# Patient Record
Sex: Female | Born: 1995 | Marital: Married | Smoking: Never smoker
Health system: Southern US, Community
[De-identification: ages and names within clinical notes are randomized; demographics above are authoritative.]

## PROBLEM LIST (undated history)

## (undated) DIAGNOSIS — O24419 Gestational diabetes mellitus in pregnancy, unspecified control: Secondary | ICD-10-CM

## (undated) DIAGNOSIS — E079 Disorder of thyroid, unspecified: Secondary | ICD-10-CM

## (undated) HISTORY — PX: EYE SURGERY: SHX253

## (undated) HISTORY — DX: Gestational diabetes mellitus in pregnancy, unspecified control: O24.419

---

## 2016-06-22 ENCOUNTER — Ambulatory Visit (INDEPENDENT_AMBULATORY_CARE_PROVIDER_SITE_OTHER): Payer: Medicaid Other | Admitting: Obstetrics & Gynecology

## 2016-06-22 ENCOUNTER — Other Ambulatory Visit: Payer: Self-pay | Admitting: Obstetrics & Gynecology

## 2016-06-22 ENCOUNTER — Other Ambulatory Visit (HOSPITAL_COMMUNITY)
Admission: RE | Admit: 2016-06-22 | Discharge: 2016-06-22 | Disposition: A | Discharge status: Home / Self Care | Payer: Medicaid Other | Source: Ambulatory Visit | Attending: Obstetrics & Gynecology | Admitting: Obstetrics & Gynecology

## 2016-06-22 ENCOUNTER — Encounter: Payer: Self-pay | Admitting: Obstetrics & Gynecology

## 2016-06-22 VITALS — BP 111/67 | HR 105 | Ht 61.0 in | Wt 170.0 lb

## 2016-06-22 DIAGNOSIS — O34219 Maternal care for unspecified type scar from previous cesarean delivery: Secondary | ICD-10-CM | POA: Insufficient documentation

## 2016-06-22 DIAGNOSIS — Z3490 Encounter for supervision of normal pregnancy, unspecified, unspecified trimester: Secondary | ICD-10-CM

## 2016-06-22 DIAGNOSIS — Z3480 Encounter for supervision of other normal pregnancy, unspecified trimester: Secondary | ICD-10-CM | POA: Insufficient documentation

## 2016-06-22 DIAGNOSIS — Z8632 Personal history of gestational diabetes: Secondary | ICD-10-CM

## 2016-06-22 DIAGNOSIS — Z01419 Encounter for gynecological examination (general) (routine) without abnormal findings: Secondary | ICD-10-CM | POA: Insufficient documentation

## 2016-06-22 DIAGNOSIS — O09299 Supervision of pregnancy with other poor reproductive or obstetric history, unspecified trimester: Secondary | ICD-10-CM | POA: Insufficient documentation

## 2016-06-22 DIAGNOSIS — Z113 Encounter for screening for infections with a predominantly sexual mode of transmission: Secondary | ICD-10-CM | POA: Insufficient documentation

## 2016-06-22 DIAGNOSIS — Z3492 Encounter for supervision of normal pregnancy, unspecified, second trimester: Secondary | ICD-10-CM

## 2016-06-22 DIAGNOSIS — O09292 Supervision of pregnancy with other poor reproductive or obstetric history, second trimester: Secondary | ICD-10-CM | POA: Diagnosis not present

## 2016-06-22 NOTE — Progress Notes (Signed)
  Subjective:    Caitlyn Mccormick is being seen today for her first obstetrical visit.  This is not a planned pregnancy. She is at 3063w3d gestation. Her obstetrical history is significant for GDM with ehr last pregnancy.  She was diet controlled. Relationship with FOB: spouse, living together. Patient does intend to breast feed. Pregnancy history fully reviewed.  Pt was recently on atbx for a UTI. Now has itching. [place] to confirm [place].    Patient reports nausea in early pregnancy that has resolved.  Review of Systems:   Review of Systems [place]  Objective:     BP 111/67   Pulse (!) 105   Ht 5\' 1"  (1.549 m)   Wt 170 lb (77.1 kg)   LMP 02/14/2016   BMI 32.12 kg/m  Physical Exam  Exam General Appearance:    Alert, cooperative, no distress, appears stated age  Head:    Normocephalic, without obvious abnormality, atraumatic  Eyes:    conjunctiva/corneas clear, EOM's intact, both eyes  Ears:    Normal external ear canals, both ears  Nose:   Nares normal, septum midline, mucosa normal, no drainage    or sinus tenderness  Throat:   Lips, mucosa, and tongue normal; teeth and gums normal  Neck:   Supple, symmetrical, trachea midline, no adenopathy;    thyroid:  no enlargement/tenderness/nodules  Back:     Symmetric, no curvature, ROM normal, no CVA tenderness  Lungs:     Clear to auscultation bilaterally, respirations unlabored  Chest Wall:    No tenderness or deformity   Heart:    Regular rate and rhythm, S1 and S2 normal, no murmur, rub   or gallop  Breast Exam:    No tenderness, masses, or nipple abnormality  Abdomen:     Soft, non-tender, bowel sounds active all four quadrants,    no masses, no organomegaly  Genitalia:    Normal female without lesion, discharge or tenderness; thick white discharge and erythema     Extremities:   Extremities normal, atraumatic, no cyanosis or edema  Pulses:   2+ and symmetric all extremities  Skin:   Skin color, texture, turgor  normal, no rashes or lesions     Assessment:    Pregnancy: G2P1001    Patient Active Problem List   Diagnosis Date Noted  . Supervision of other normal pregnancy, antepartum 06/22/2016  . Pregnancy with history of cesarean section, antepartum 06/22/2016  . History of gestational diabetes in prior pregnancy, currently pregnant 06/22/2016      Plan:     Initial labs drawn. Prenatal vitamins. Problem list reviewed and updated. AFP3 discussed: requested. Role of ultrasound in pregnancy discussed; fetal survey: requested. Amniocentesis discussed: not indicated. Follow up in 4 weeks. 70% of 45 min visit spent on counseling and coordination of care.  1 hour GTT with HgbA1C  Willodean Rosenthalarolyn Harraway-Smith 06/22/2016

## 2016-06-22 NOTE — Patient Instructions (Signed)
Second Trimester of Pregnancy The second trimester is from week 13 through week 28 (months 4 through 6). The second trimester is often a time when you feel your best. Your body has also adjusted to being pregnant, and you begin to feel better physically. Usually, morning sickness has lessened or quit completely, you may have more energy, and you may have an increase in appetite. The second trimester is also a time when the fetus is growing rapidly. At the end of the sixth month, the fetus is about 9 inches long and weighs about 1 pounds. You will likely begin to feel the baby move (quickening) between 18 and 20 weeks of the pregnancy. Body changes during your second trimester Your body continues to go through many changes during your second trimester. The changes vary from woman to woman.  Your weight will continue to increase. You will notice your lower abdomen bulging out.  You may begin to get stretch marks on your hips, abdomen, and breasts.  You may develop headaches that can be relieved by medicines. The medicines should be approved by your health care provider.  You may urinate more often because the fetus is pressing on your bladder.  You may develop or continue to have heartburn as a result of your pregnancy.  You may develop constipation because certain hormones are causing the muscles that push waste through your intestines to slow down.  You may develop hemorrhoids or swollen, bulging veins (varicose veins).  You may have back pain. This is caused by:  Weight gain.  Pregnancy hormones that are relaxing the joints in your pelvis.  A shift in weight and the muscles that support your balance.  Your breasts will continue to grow and they will continue to become tender.  Your gums may bleed and may be sensitive to brushing and flossing.  Dark spots or blotches (chloasma, mask of pregnancy) may develop on your face. This will likely fade after the baby is born.  A dark line  from your belly button to the pubic area (linea nigra) may appear. This will likely fade after the baby is born.  You may have changes in your hair. These can include thickening of your hair, rapid growth, and changes in texture. Some women also have hair loss during or after pregnancy, or hair that feels dry or thin. Your hair will most likely return to normal after your baby is born. What to expect at prenatal visits During a routine prenatal visit:  You will be weighed to make sure you and the fetus are growing normally.  Your blood pressure will be taken.  Your abdomen will be measured to track your baby's growth.  The fetal heartbeat will be listened to.  Any test results from the previous visit will be discussed. Your health care provider may ask you:  How you are feeling.  If you are feeling the baby move.  If you have had any abnormal symptoms, such as leaking fluid, bleeding, severe headaches, or abdominal cramping.  If you are using any tobacco products, including cigarettes, chewing tobacco, and electronic cigarettes.  If you have any questions. Other tests that may be performed during your second trimester include:  Blood tests that check for:  Low iron levels (anemia).  Gestational diabetes (between 24 and 28 weeks).  Rh antibodies. This is to check for a protein on red blood cells (Rh factor).  Urine tests to check for infections, diabetes, or protein in the urine.  An ultrasound to   confirm the proper growth and development of the baby.  An amniocentesis to check for possible genetic problems.  Fetal screens for spina bifida and Down syndrome.  HIV (human immunodeficiency virus) testing. Routine prenatal testing includes screening for HIV, unless you choose not to have this test. Follow these instructions at home: Eating and drinking  Continue to eat regular, healthy meals.  Avoid raw meat, uncooked cheese, cat litter boxes, and soil used by cats. These  carry germs that can cause birth defects in the baby.  Take your prenatal vitamins.  Take 1500-2000 mg of calcium daily starting at the 20th week of pregnancy until you deliver your baby.  If you develop constipation:  Take over-the-counter or prescription medicines.  Drink enough fluid to keep your urine clear or pale yellow.  Eat foods that are high in fiber, such as fresh fruits and vegetables, whole grains, and beans.  Limit foods that are high in fat and processed sugars, such as fried and sweet foods. Activity  Exercise only as directed by your health care provider. Experiencing uterine cramps is a good sign to stop exercising.  Avoid heavy lifting, wear low heel shoes, and practice good posture.  Wear your seat belt at all times when driving.  Rest with your legs elevated if you have leg cramps or low back pain.  Wear a good support bra for breast tenderness.  Do not use hot tubs, steam rooms, or saunas. Lifestyle  Avoid all smoking, herbs, alcohol, and unprescribed drugs. These chemicals affect the formation and growth of the baby.  Do not use any products that contain nicotine or tobacco, such as cigarettes and e-cigarettes. If you need help quitting, ask your health care provider.  A sexual relationship may be continued unless your health care provider directs you otherwise. General instructions  Follow your health care provider's instructions regarding medicine use. There are medicines that are either safe or unsafe to take during pregnancy.  Take warm sitz baths to soothe any pain or discomfort caused by hemorrhoids. Use hemorrhoid cream if your health care provider approves.  If you develop varicose veins, wear support hose. Elevate your feet for 15 minutes, 3-4 times a day. Limit salt in your diet.  Visit your dentist if you have not gone yet during your pregnancy. Use a soft toothbrush to brush your teeth and be gentle when you floss.  Keep all follow-up  prenatal visits as told by your health care provider. This is important. Contact a health care provider if:  You have dizziness.  You have mild pelvic cramps, pelvic pressure, or nagging pain in the abdominal area.  You have persistent nausea, vomiting, or diarrhea.  You have a bad smelling vaginal discharge.  You have pain with urination. Get help right away if:  You have a fever.  You are leaking fluid from your vagina.  You have spotting or bleeding from your vagina.  You have severe abdominal cramping or pain.  You have rapid weight gain or weight loss.  You have shortness of breath with chest pain.  You notice sudden or extreme swelling of your face, hands, ankles, feet, or legs.  You have not felt your baby move in over an hour.  You have severe headaches that do not go away with medicine.  You have vision changes. Summary  The second trimester is from week 13 through week 28 (months 4 through 6). It is also a time when the fetus is growing rapidly.  Your body goes   through many changes during pregnancy. The changes vary from woman to woman.  Avoid all smoking, herbs, alcohol, and unprescribed drugs. These chemicals affect the formation and growth your baby.  Do not use any tobacco products, such as cigarettes, chewing tobacco, and e-cigarettes. If you need help quitting, ask your health care provider.  Contact your health care provider if you have any questions. Keep all prenatal visits as told by your health care provider. This is important. This information is not intended to replace advice given to you by your health care provider. Make sure you discuss any questions you have with your health care provider. Document Released: 06/16/2001 Document Revised: 11/28/2015 Document Reviewed: 08/23/2012 Elsevier Interactive Patient Education  2017 [employer]  

## 2016-06-22 NOTE — Progress Notes (Signed)
Patient states she had a UTI she was seen in the ED for about a month. Patient states she is feeling flutters. Armandina StammerJennifer Gracynn Rajewski RNBSN

## 2016-06-23 LAB — OBSTETRIC PANEL
Antibody Screen: NEGATIVE
Basophils Absolute: 0 cells/uL (ref 0–200)
Basophils Relative: 0 %
Eosinophils Absolute: 95 cells/uL (ref 15–500)
Eosinophils Relative: 1 %
HCT: 33.8 % — ABNORMAL LOW (ref 35.0–45.0)
HEP B S AG: NEGATIVE
Hemoglobin: 10.8 g/dL — ABNORMAL LOW (ref 11.7–15.5)
LYMPHS ABS: 1710 {cells}/uL (ref 850–3900)
Lymphocytes Relative: 18 %
MCH: 25 pg — AB (ref 27.0–33.0)
MCHC: 32 g/dL (ref 32.0–36.0)
MCV: 78.2 fL — AB (ref 80.0–100.0)
MONO ABS: 475 {cells}/uL (ref 200–950)
MPV: 9 fL (ref 7.5–12.5)
Monocytes Relative: 5 %
NEUTROS PCT: 76 %
Neutro Abs: 7220 cells/uL (ref 1500–7800)
PLATELETS: 233 10*3/uL (ref 140–400)
RBC: 4.32 MIL/uL (ref 3.80–5.10)
RDW: 15.6 % — AB (ref 11.0–15.0)
RH TYPE: NEGATIVE
RUBELLA: 1.3 {index} — AB (ref <0.90)
WBC: 9.5 10*3/uL (ref 3.8–10.8)

## 2016-06-23 LAB — AFP, QUAD SCREEN
AFP: 49.1 ng/mL
Curr Gest Age: 18.4 weeks
Down Syndrome Scr Risk Est: 1:31500 {titer}
HCG TOTAL: 35.8 [IU]/mL
INH: 175.9 pg/mL
Interpretation-AFP: NEGATIVE
MoM for AFP: 1.16
MoM for INH: 1.08
MoM for hCG: 1.48
OPEN SPINA BIFIDA: NEGATIVE
Tri 18 Scr Risk Est: NEGATIVE
Trisomy 18 (Edward) Syndrome Interp.: <1:115000 {titer}
UE3 MOM: 1.56
uE3 Value: 2.03 ng/mL

## 2016-06-23 LAB — HEMOGLOBIN A1C
Hgb A1c MFr Bld: 5.1 % (ref <5.7)
Mean Plasma Glucose: 100 mg/dL

## 2016-06-23 LAB — HIV ANTIBODY (ROUTINE TESTING W REFLEX): HIV 1&2 Ab, 4th Generation: NONREACTIVE

## 2016-06-23 LAB — WET PREP BY MOLECULAR PROBE
Candida species: NEGATIVE
GARDNERELLA VAGINALIS: NEGATIVE
Trichomonas vaginosis: NEGATIVE

## 2016-06-23 LAB — GC/CHLAMYDIA PROBE AMP (~~LOC~~) NOT AT ARMC
Chlamydia: NEGATIVE
NEISSERIA GONORRHEA: NEGATIVE

## 2016-06-23 LAB — GLUCOSE TOLERANCE, 1 HOUR (50G) W/O FASTING: Glucose, 1 Hr, gestational: 81 mg/dL (ref <140)

## 2016-06-24 LAB — CYTOLOGY - PAP: Diagnosis: NEGATIVE

## 2016-06-25 ENCOUNTER — Encounter: Payer: Self-pay | Admitting: Obstetrics & Gynecology

## 2016-06-25 DIAGNOSIS — O26899 Other specified pregnancy related conditions, unspecified trimester: Secondary | ICD-10-CM

## 2016-06-25 DIAGNOSIS — Z6791 Unspecified blood type, Rh negative: Secondary | ICD-10-CM | POA: Insufficient documentation

## 2016-06-25 DIAGNOSIS — O99019 Anemia complicating pregnancy, unspecified trimester: Secondary | ICD-10-CM | POA: Insufficient documentation

## 2016-06-25 LAB — PAIN MGMT, OPIATES EXP. QN, UR
Codeine: NEGATIVE ng/mL (ref <50)
Hydrocodone: NEGATIVE ng/mL (ref <50)
Hydromorphone: NEGATIVE ng/mL (ref <50)
MORPHINE: NEGATIVE ng/mL (ref <50)
NORHYDROCODONE: NEGATIVE ng/mL (ref <50)
NOROXYCODONE: NEGATIVE ng/mL (ref <50)
OXYCODONE: NEGATIVE ng/mL (ref <50)
Oxymorphone: NEGATIVE ng/mL (ref <50)

## 2016-06-25 LAB — CULTURE, URINE COMPREHENSIVE

## 2016-06-26 ENCOUNTER — Telehealth: Payer: Self-pay

## 2016-06-26 ENCOUNTER — Ambulatory Visit (HOSPITAL_COMMUNITY): Payer: Medicaid Other

## 2016-06-26 NOTE — Telephone Encounter (Signed)
Left message for patient to return call to office.  Ocean Schildt RNBSN 

## 2016-06-26 NOTE — Telephone Encounter (Signed)
-----   Message from Caitlyn Rosenthalarolyn Harraway-Smith, MD sent at 06/25/2016  5:20 PM EST ----- Please call pt. She is anemic. She should begin FeSO4 OTC once a day.  clh-[id number]

## 2016-06-30 ENCOUNTER — Ambulatory Visit (HOSPITAL_COMMUNITY)
Admission: RE | Admit: 2016-06-30 | Discharge: 2016-06-30 | Disposition: A | Discharge status: Home / Self Care | Payer: Medicaid Other | Source: Ambulatory Visit | Attending: Obstetrics & Gynecology | Admitting: Obstetrics & Gynecology

## 2016-06-30 DIAGNOSIS — Z6791 Unspecified blood type, Rh negative: Secondary | ICD-10-CM | POA: Insufficient documentation

## 2016-06-30 DIAGNOSIS — Z3490 Encounter for supervision of normal pregnancy, unspecified, unspecified trimester: Secondary | ICD-10-CM

## 2016-06-30 DIAGNOSIS — O34211 Maternal care for low transverse scar from previous cesarean delivery: Secondary | ICD-10-CM | POA: Diagnosis not present

## 2016-06-30 DIAGNOSIS — O09292 Supervision of pregnancy with other poor reproductive or obstetric history, second trimester: Secondary | ICD-10-CM | POA: Insufficient documentation

## 2016-06-30 DIAGNOSIS — Z3A19 19 weeks gestation of pregnancy: Secondary | ICD-10-CM | POA: Insufficient documentation

## 2016-06-30 DIAGNOSIS — O0932 Supervision of pregnancy with insufficient antenatal care, second trimester: Secondary | ICD-10-CM | POA: Insufficient documentation

## 2016-06-30 DIAGNOSIS — Z3480 Encounter for supervision of other normal pregnancy, unspecified trimester: Secondary | ICD-10-CM

## 2016-06-30 DIAGNOSIS — O99212 Obesity complicating pregnancy, second trimester: Secondary | ICD-10-CM | POA: Insufficient documentation

## 2016-06-30 DIAGNOSIS — O26893 Other specified pregnancy related conditions, third trimester: Secondary | ICD-10-CM | POA: Insufficient documentation

## 2016-06-30 IMAGING — US US MFM OB COMP +14 WKS
1 series · 14 of 28 positions shown · non-contrast
Comparison: none

[Series 1: us mfm ob comp +14 wks · 98 acquisitions, 14 frames shown]
[im 4/98]
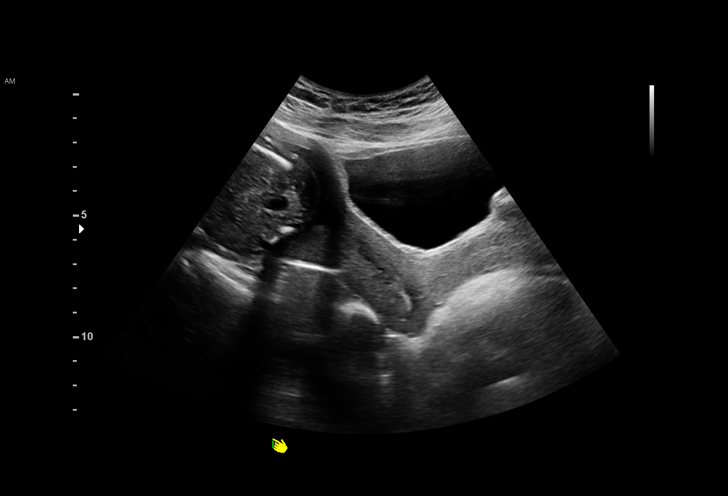
[im 11/98]
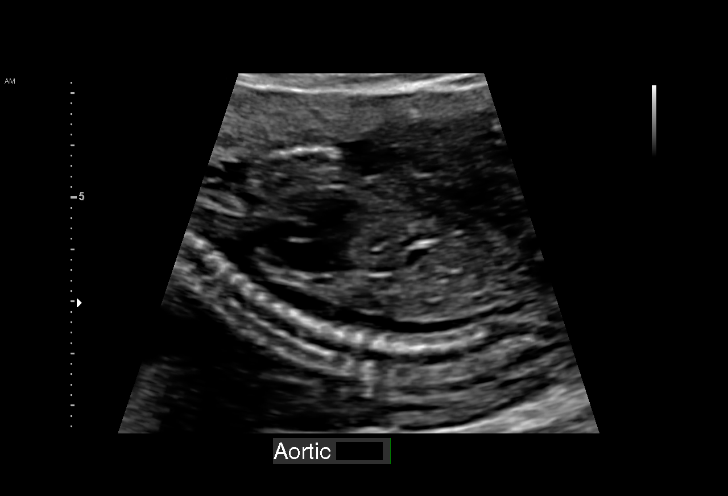
[im 18/98]
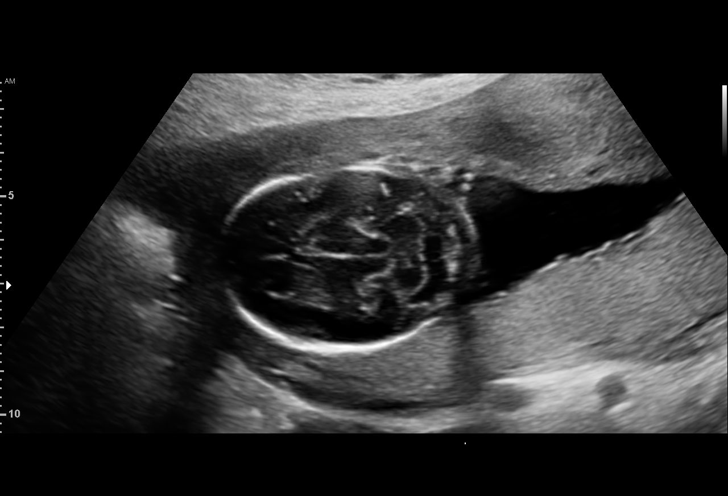
[im 26/98]
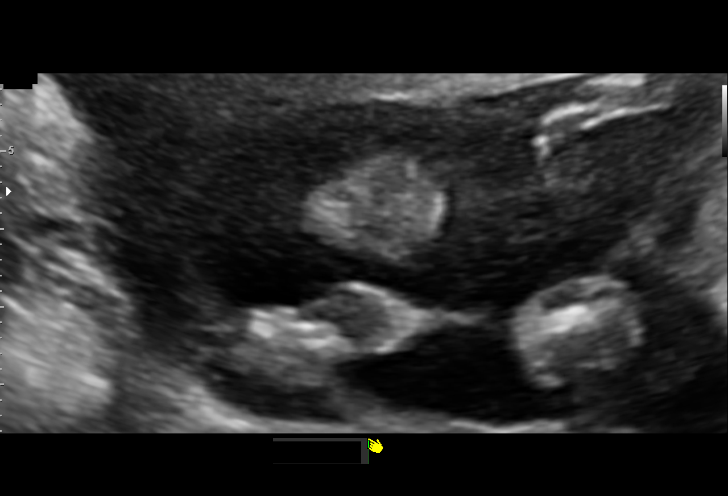
[im 33/98]
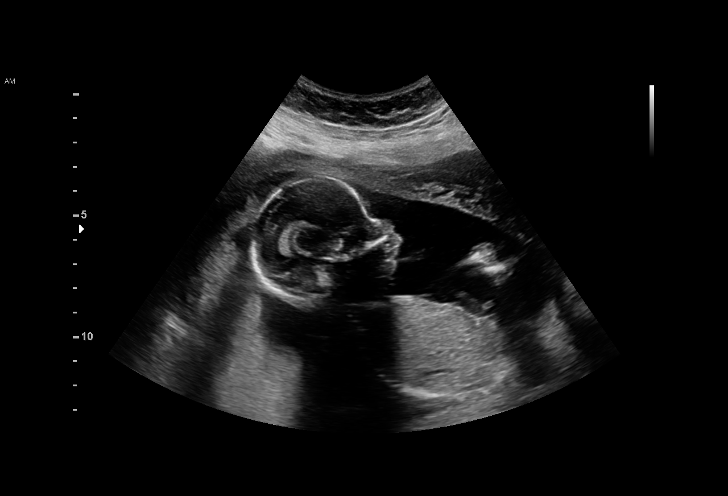
[im 40/98]
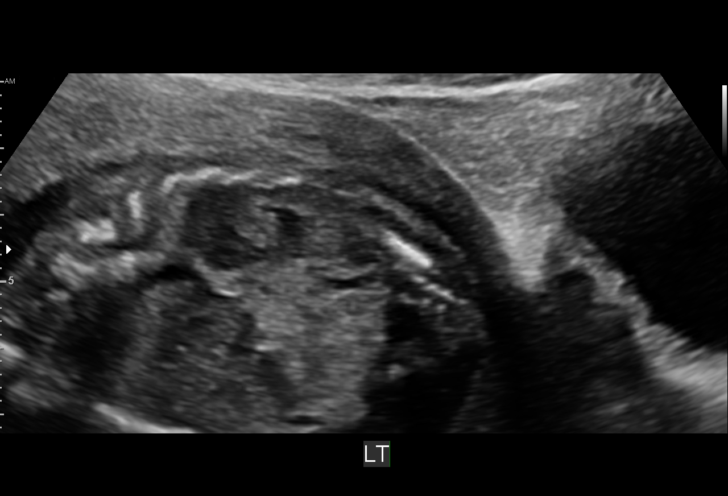
[im 47/98]
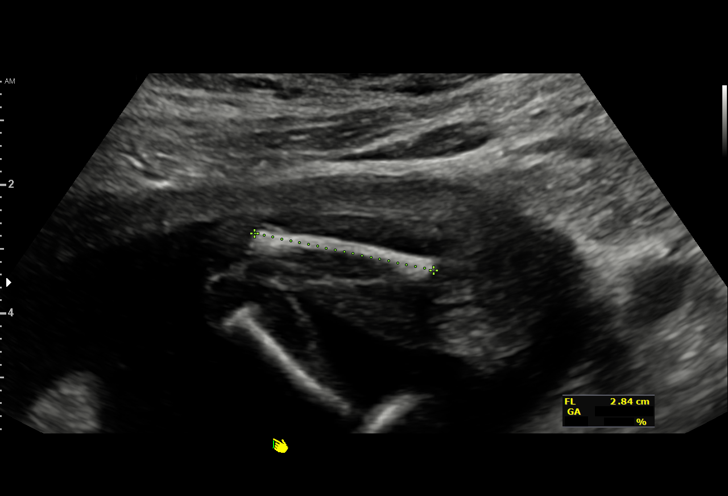
[im 54/98]
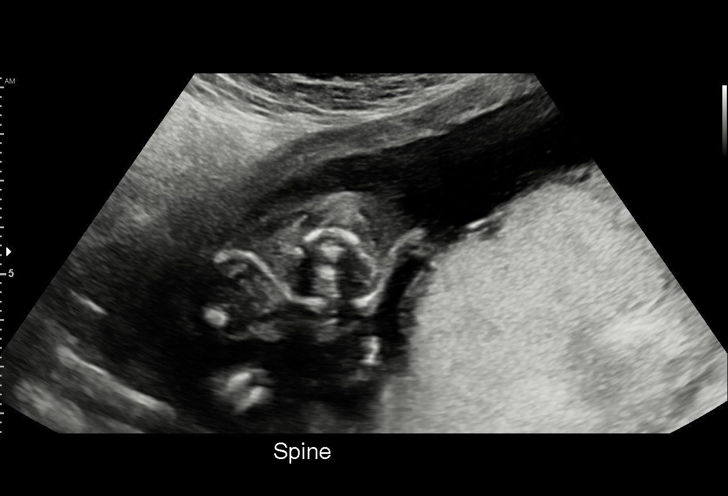
[im 62/98]
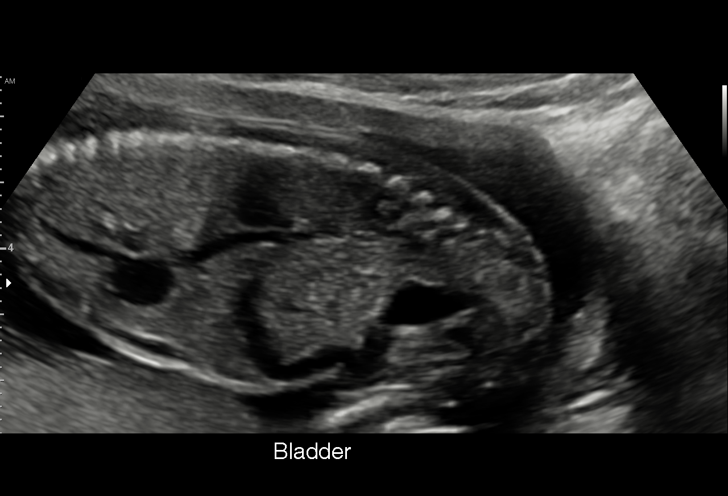
[im 69/98]
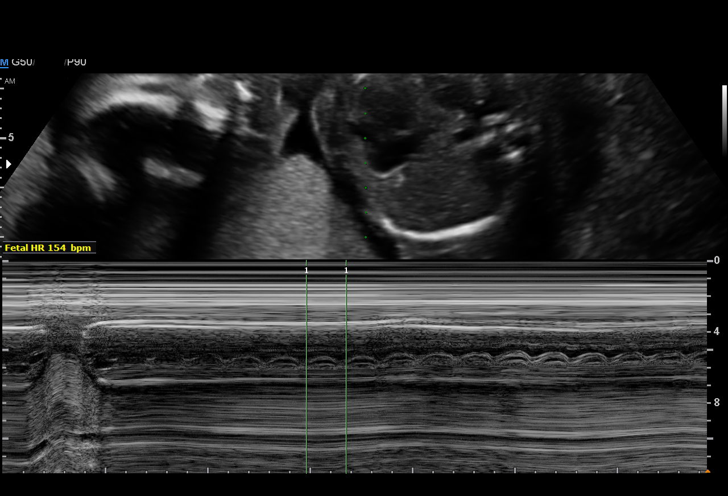
[im 76/98]
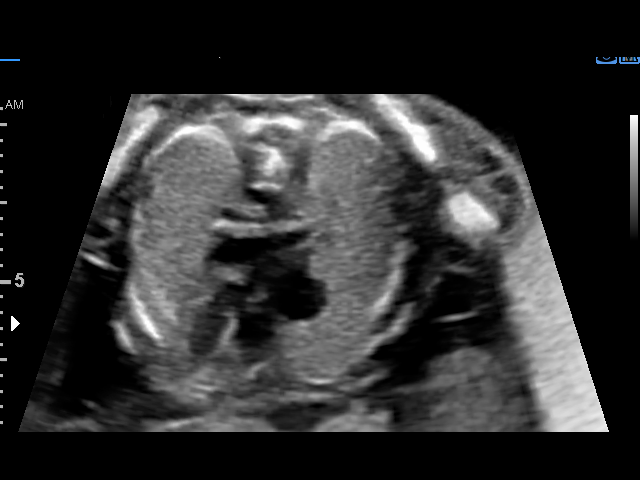
[im 83/98]
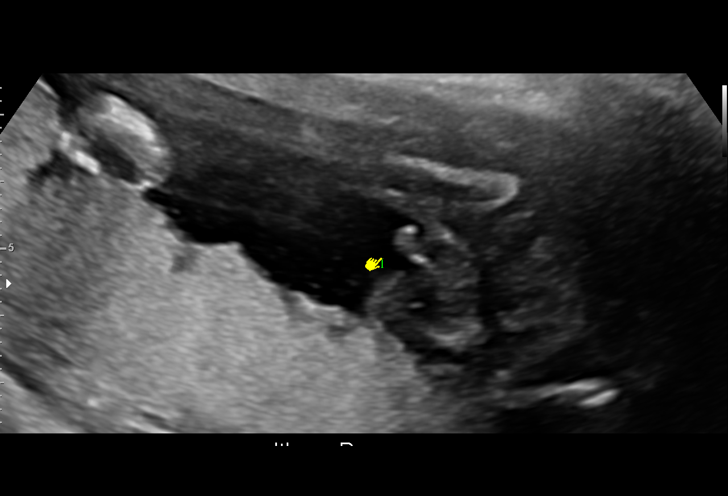
[im 90/98]
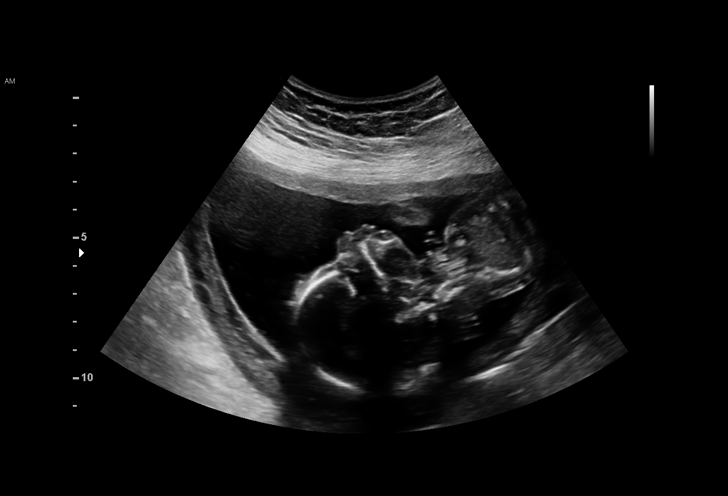
[im 98/98]
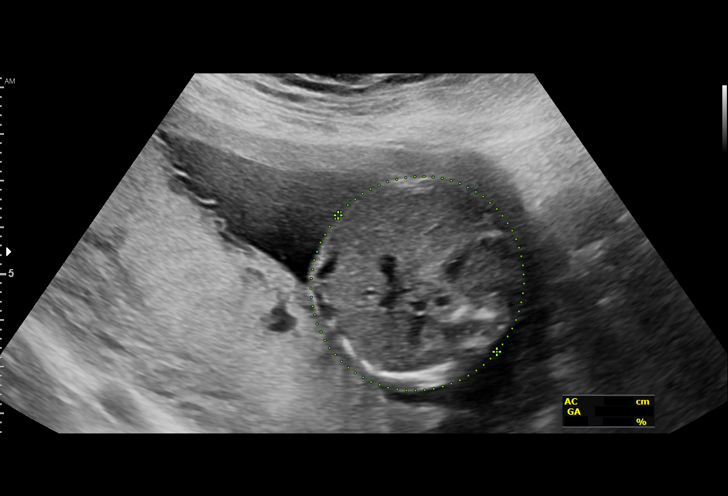

[14 of 28 positions shown; findings below may reference images not displayed]

OURARI

OURARI
Indications

19 weeks gestation of pregnancy
Obesity complicating pregnancy, second         [VH]
trimester
History of cesarean delivery, currently        [VH]
pregnant
Rh negative state in antepartum                [VH]
Poor obstetric history: Previous gestational   [VH]
diabetes
Late prenatal care, second trimester           [VH]
OB History

Blood Type:            Height:  5'1"   Weight (lb):  170       BMI:
Gravidity:    2         Term:   1        Prem:   0        SAB:   0
TOP:          0       Ectopic:  0        Living: 1
Fetal Evaluation

Num Of Fetuses:     1
Fetal Heart         154
Rate(bpm):
Cardiac Activity:   Observed
Presentation:       Variable
Placenta:           Posterior, above cervical os
P. Cord Insertion:  Visualized, central

Amniotic Fluid
AFI FV:      Subjectively within normal limits

Largest Pocket(cm)
3.44
Biometry

BPD:      41.7  mm     G. Age:  18w 4d         15  %    CI:        66.58   %    70 - 86
FL/HC:      17.9   %    16.8 -
HC:      163.8  mm     G. Age:  19w 1d         23  %    HC/AC:      1.16        1.09 -
AC:      141.7  mm     G. Age:  19w 4d         44  %    FL/BPD:     70.3   %
FL:       29.3  mm     G. Age:  19w 0d         24  %    FL/AC:      20.7   %    20 - 24
CER:      18.9  mm     G. Age:  18w 3d         19  %
NFT:       6.1  mm
CM:        5.3  mm

Est. FW:     282  gm    0 lb 10 oz      41  %
Gestational Age

LMP:           19w 4d        Date:  [DATE]                 EDD:   [DATE]
U/S Today:     19w 1d                                        EDD:   [DATE]
Best:          19w 4d     Det. By:  LMP  ([DATE])          EDD:   [DATE]
Anatomy

Cranium:               Appears normal         Aortic Arch:            Appears normal
Cavum:                 Appears normal         Ductal Arch:            Appears normal
Ventricles:            Appears normal         Diaphragm:              Appears normal
Choroid Plexus:        Appears normal         Stomach:                Appears normal, left
sided
Cerebellum:            Appears normal         Abdomen:                Appears normal
Posterior Fossa:       Appears normal         Abdominal Wall:         Appears nml (cord
insert, abd wall)
Nuchal Fold:           Appears normal         Cord Vessels:           Appears normal (3
vessel cord)
Face:                  Appears normal         Kidneys:                Appear normal
(orbits and profile)
Lips:                  Appears normal         Bladder:                Appears normal
Thoracic:              Appears normal         Spine:                  Appears normal
Heart:                 Appears normal         Upper Extremities:      Appears normal
(4CH, axis, and situs
RVOT:                  Appears normal         Lower Extremities:      Appears normal
LVOT:                  Appears normal

Other:  Fetus appears to be a male. Nasal bone visualized. Technically
difficult due to maternal habitus and fetal position.
Cervix Uterus Adnexa

Cervix
Length:           3.47  cm.
Normal appearance by transabdominal scan.

Uterus
No abnormality visualized.

Left Ovary
Within normal limits.

Right Ovary
Within normal limits.

Adnexa:       No abnormality visualized.
Impression

SIUP at [VH] on attempted comprehensive survey
active singleton fetus
EFW 41st%'le
no dysmorphic features demonstrated
no previa
Recommendations

Follow up as clinically indicated.
Consider interval growth in 6-8 weeks (obesity).

## 2016-07-02 NOTE — Addendum Note (Signed)
Encounter addended by: Willodean Rosenthalarolyn Harraway-Smith, MD on: 07/02/2016  8:58 PM<BR>    Actions taken: Visit diagnoses modified, Problem List modified

## 2016-07-07 NOTE — Telephone Encounter (Signed)
Patient made aware. Armandina StammerJennifer Yaneli Keithley RNBSN

## 2016-07-23 ENCOUNTER — Encounter: Payer: Medicaid Other | Admitting: Obstetrics & Gynecology

## 2016-08-06 ENCOUNTER — Ambulatory Visit (INDEPENDENT_AMBULATORY_CARE_PROVIDER_SITE_OTHER): Payer: Medicaid Other | Admitting: Family Medicine

## 2016-08-06 VITALS — BP 117/66 | HR 110 | Wt 176.0 lb

## 2016-08-06 DIAGNOSIS — Z3482 Encounter for supervision of other normal pregnancy, second trimester: Secondary | ICD-10-CM | POA: Diagnosis not present

## 2016-08-06 DIAGNOSIS — Z3480 Encounter for supervision of other normal pregnancy, unspecified trimester: Secondary | ICD-10-CM

## 2016-08-06 DIAGNOSIS — K219 Gastro-esophageal reflux disease without esophagitis: Secondary | ICD-10-CM

## 2016-08-06 MED ORDER — PRENATAL VITAMINS 0.8 MG PO TABS: 1.0000 | ORAL_TABLET | Freq: Every day | ORAL | 12 refills | Status: AC

## 2016-08-06 MED ORDER — OMEPRAZOLE MAGNESIUM 20 MG PO TBEC: 20.0000 mg | DELAYED_RELEASE_TABLET | Freq: Every day | ORAL | 3 refills | Status: DC

## 2016-08-06 NOTE — Patient Instructions (Signed)
Second Trimester of Pregnancy The second trimester is from week 13 through week 28 (months 4 through 6). The second trimester is often a time when you feel your best. Your body has also adjusted to being pregnant, and you begin to feel better physically. Usually, morning sickness has lessened or quit completely, you may have more energy, and you may have an increase in appetite. The second trimester is also a time when the fetus is growing rapidly. At the end of the sixth month, the fetus is about 9 inches long and weighs about 1 pounds. You will likely begin to feel the baby move (quickening) between 18 and 20 weeks of the pregnancy. Body changes during your second trimester Your body continues to go through many changes during your second trimester. The changes vary from woman to woman.  Your weight will continue to increase. You will notice your lower abdomen bulging out.  You may begin to get stretch marks on your hips, abdomen, and breasts.  You may develop headaches that can be relieved by medicines. The medicines should be approved by your health care provider.  You may urinate more often because the fetus is pressing on your bladder.  You may develop or continue to have heartburn as a result of your pregnancy.  You may develop constipation because certain hormones are causing the muscles that push waste through your intestines to slow down.  You may develop hemorrhoids or swollen, bulging veins (varicose veins).  You may have back pain. This is caused by:  Weight gain.  Pregnancy hormones that are relaxing the joints in your pelvis.  A shift in weight and the muscles that support your balance.  Your breasts will continue to grow and they will continue to become tender.  Your gums may bleed and may be sensitive to brushing and flossing.  Dark spots or blotches (chloasma, mask of pregnancy) may develop on your face. This will likely fade after the baby is born.  A dark line  from your belly button to the pubic area (linea nigra) may appear. This will likely fade after the baby is born.  You may have changes in your hair. These can include thickening of your hair, rapid growth, and changes in texture. Some women also have hair loss during or after pregnancy, or hair that feels dry or thin. Your hair will most likely return to normal after your baby is born. What to expect at prenatal visits During a routine prenatal visit:  You will be weighed to make sure you and the fetus are growing normally.  Your blood pressure will be taken.  Your abdomen will be measured to track your baby's growth.  The fetal heartbeat will be listened to.  Any test results from the previous visit will be discussed. Your health care provider may ask you:  How you are feeling.  If you are feeling the baby move.  If you have had any abnormal symptoms, such as leaking fluid, bleeding, severe headaches, or abdominal cramping.  If you are using any tobacco products, including cigarettes, chewing tobacco, and electronic cigarettes.  If you have any questions. Other tests that may be performed during your second trimester include:  Blood tests that check for:  Low iron levels (anemia).  Gestational diabetes (between 24 and 28 weeks).  Rh antibodies. This is to check for a protein on red blood cells (Rh factor).  Urine tests to check for infections, diabetes, or protein in the urine.  An ultrasound to   confirm the proper growth and development of the baby.  An amniocentesis to check for possible genetic problems.  Fetal screens for spina bifida and Down syndrome.  HIV (human immunodeficiency virus) testing. Routine prenatal testing includes screening for HIV, unless you choose not to have this test. Follow these instructions at home: Eating and drinking  Continue to eat regular, healthy meals.  Avoid raw meat, uncooked cheese, cat litter boxes, and soil used by cats. These  carry germs that can cause birth defects in the baby.  Take your prenatal vitamins.  Take 1500-2000 mg of calcium daily starting at the 20th week of pregnancy until you deliver your baby.  If you develop constipation:  Take over-the-counter or prescription medicines.  Drink enough fluid to keep your urine clear or pale yellow.  Eat foods that are high in fiber, such as fresh fruits and vegetables, whole grains, and beans.  Limit foods that are high in fat and processed sugars, such as fried and sweet foods. Activity  Exercise only as directed by your health care provider. Experiencing uterine cramps is a good sign to stop exercising.  Avoid heavy lifting, wear low heel shoes, and practice good posture.  Wear your seat belt at all times when driving.  Rest with your legs elevated if you have leg cramps or low back pain.  Wear a good support bra for breast tenderness.  Do not use hot tubs, steam rooms, or saunas. Lifestyle  Avoid all smoking, herbs, alcohol, and unprescribed drugs. These chemicals affect the formation and growth of the baby.  Do not use any products that contain nicotine or tobacco, such as cigarettes and e-cigarettes. If you need help quitting, ask your health care provider.  A sexual relationship may be continued unless your health care provider directs you otherwise. General instructions  Follow your health care provider's instructions regarding medicine use. There are medicines that are either safe or unsafe to take during pregnancy.  Take warm sitz baths to soothe any pain or discomfort caused by hemorrhoids. Use hemorrhoid cream if your health care provider approves.  If you develop varicose veins, wear support hose. Elevate your feet for 15 minutes, 3-4 times a day. Limit salt in your diet.  Visit your dentist if you have not gone yet during your pregnancy. Use a soft toothbrush to brush your teeth and be gentle when you floss.  Keep all follow-up  prenatal visits as told by your health care provider. This is important. Contact a health care provider if:  You have dizziness.  You have mild pelvic cramps, pelvic pressure, or nagging pain in the abdominal area.  You have persistent nausea, vomiting, or diarrhea.  You have a bad smelling vaginal discharge.  You have pain with urination. Get help right away if:  You have a fever.  You are leaking fluid from your vagina.  You have spotting or bleeding from your vagina.  You have severe abdominal cramping or pain.  You have rapid weight gain or weight loss.  You have shortness of breath with chest pain.  You notice sudden or extreme swelling of your face, hands, ankles, feet, or legs.  You have not felt your baby move in over an hour.  You have severe headaches that do not go away with medicine.  You have vision changes. Summary  The second trimester is from week 13 through week 28 (months 4 through 6). It is also a time when the fetus is growing rapidly.  Your body goes   through many changes during pregnancy. The changes vary from woman to woman.  Avoid all smoking, herbs, alcohol, and unprescribed drugs. These chemicals affect the formation and growth your baby.  Do not use any tobacco products, such as cigarettes, chewing tobacco, and e-cigarettes. If you need help quitting, ask your health care provider.  Contact your health care provider if you have any questions. Keep all prenatal visits as told by your health care provider. This is important. This information is not intended to replace advice given to you by your health care provider. Make sure you discuss any questions you have with your health care provider. Document Released: 06/16/2001 Document Revised: 11/28/2015 Document Reviewed: 08/23/2012 Elsevier Interactive Patient Education  2017 [employer]   Breastfeeding Deciding to breastfeed is one of the best choices you can make for you and your baby. A  change in hormones during pregnancy causes your breast tissue to grow and increases the number and size of your milk ducts. These hormones also allow proteins, sugars, and fats from your blood supply to make breast milk in your milk-producing glands. Hormones prevent breast milk from being released before your baby is born as well as prompt milk flow after birth. Once breastfeeding has begun, thoughts of your baby, as well as his or her sucking or crying, can stimulate the release of milk from your milk-producing glands. Benefits of breastfeeding For Your Baby  Your first milk (colostrum) helps your baby's digestive system function better.  There are antibodies in your milk that help your baby fight off infections.  Your baby has a lower incidence of asthma, allergies, and sudden infant death syndrome.  The nutrients in breast milk are better for your baby than infant formulas and are designed uniquely for your baby's needs.  Breast milk improves your baby's brain development.  Your baby is less likely to develop other conditions, such as childhood obesity, asthma, or type 2 diabetes mellitus. For You  Breastfeeding helps to create a very special bond between you and your baby.  Breastfeeding is convenient. Breast milk is always available at the correct temperature and costs nothing.  Breastfeeding helps to burn calories and helps you lose the weight gained during pregnancy.  Breastfeeding makes your uterus contract to its prepregnancy size faster and slows bleeding (lochia) after you give birth.  Breastfeeding helps to lower your risk of developing type 2 diabetes mellitus, osteoporosis, and breast or ovarian cancer later in life. Signs that your baby is hungry Early Signs of Hunger  Increased alertness or activity.  Stretching.  Movement of the head from side to side.  Movement of the head and opening of the mouth when the corner of the mouth or cheek is stroked  (rooting).  Increased sucking sounds, smacking lips, cooing, sighing, or squeaking.  Hand-to-mouth movements.  Increased sucking of fingers or hands. Late Signs of Hunger  Fussing.  Intermittent crying. Extreme Signs of Hunger  Signs of extreme hunger will require calming and consoling before your baby will be able to breastfeed successfully. Do not wait for the following signs of extreme hunger to occur before you initiate breastfeeding:  Restlessness.  A loud, strong cry.  Screaming. Breastfeeding basics  Breastfeeding Initiation  Find a comfortable place to sit or lie down, with your neck and back well supported.  Place a pillow or rolled up blanket under your baby to bring him or her to the level of your breast (if you are seated). Nursing pillows are specially designed to help   support your arms and your baby while you breastfeed.  Make sure that your baby's abdomen is facing your abdomen.  Gently massage your breast. With your fingertips, massage from your chest wall toward your nipple in a circular motion. This encourages milk flow. You may need to continue this action during the feeding if your milk flows slowly.  Support your breast with 4 fingers underneath and your thumb above your nipple. Make sure your fingers are well away from your nipple and your baby's mouth.  Stroke your baby's lips gently with your finger or nipple.  When your baby's mouth is open wide enough, quickly bring your baby to your breast, placing your entire nipple and as much of the colored area around your nipple (areola) as possible into your baby's mouth.  More areola should be visible above your baby's upper lip than below the lower lip.  Your baby's tongue should be between his or her lower gum and your breast.  Ensure that your baby's mouth is correctly positioned around your nipple (latched). Your baby's lips should create a seal on your breast and be turned out (everted).  It is common  for your baby to suck about 2-3 minutes in order to start the flow of breast milk. Latching  Teaching your baby how to latch on to your breast properly is very important. An improper latch can cause nipple pain and decreased milk supply for you and poor weight gain in your baby. Also, if your baby is not latched onto your nipple properly, he or she may swallow some air during feeding. This can make your baby fussy. Burping your baby when you switch breasts during the feeding can help to get rid of the air. However, teaching your baby to latch on properly is still the best way to prevent fussiness from swallowing air while breastfeeding. Signs that your baby has successfully latched on to your nipple:  Silent tugging or silent sucking, without causing you pain.  Swallowing heard between every 3-4 sucks.  Muscle movement above and in front of his or her ears while sucking. Signs that your baby has not successfully latched on to nipple:  Sucking sounds or smacking sounds from your baby while breastfeeding.  Nipple pain. If you think your baby has not latched on correctly, slip your finger into the corner of your baby's mouth to break the suction and place it between your baby's gums. Attempt breastfeeding initiation again. Signs of Successful Breastfeeding  Signs from your baby:  A gradual decrease in the number of sucks or complete cessation of sucking.  Falling asleep.  Relaxation of his or her body.  Retention of a small amount of milk in his or her mouth.  Letting go of your breast by himself or herself. Signs from you:  Breasts that have increased in firmness, weight, and size 1-3 hours after feeding.  Breasts that are softer immediately after breastfeeding.  Increased milk volume, as well as a change in milk consistency and color by the fifth day of breastfeeding.  Nipples that are not sore, cracked, or bleeding. Signs That Your Baby is Getting Enough Milk  Wetting at least  1-2 diapers during the first 24 hours after birth.  Wetting at least 5-6 diapers every 24 hours for the first week after birth. The urine should be clear or pale yellow by 5 days after birth.  Wetting 6-8 diapers every 24 hours as your baby continues to grow and develop.  At least 3 stools in   a 24-hour period by age 5 days. The stool should be soft and yellow.  At least 3 stools in a 24-hour period by age 7 days. The stool should be seedy and yellow.  No loss of weight greater than 10% of birth weight during the first 3 days of age.  Average weight gain of 4-7 ounces (113-198 g) per week after age 4 days.  Consistent daily weight gain by age 5 days, without weight loss after the age of 2 weeks. After a feeding, your baby may spit up a small amount. This is common. Breastfeeding frequency and duration Frequent feeding will help you make more milk and can prevent sore nipples and breast engorgement. Breastfeed when you feel the need to reduce the fullness of your breasts or when your baby shows signs of hunger. This is called "breastfeeding on demand." Avoid introducing a pacifier to your baby while you are working to establish breastfeeding (the first 4-6 weeks after your baby is born). After this time you may choose to use a pacifier. Research has shown that pacifier use during the first year of a baby's life decreases the risk of sudden infant death syndrome (SIDS). Allow your baby to feed on each breast as long as he or she wants. Breastfeed until your baby is finished feeding. When your baby unlatches or falls asleep while feeding from the first breast, offer the second breast. Because newborns are often sleepy in the first few weeks of life, you may need to awaken your baby to get him or her to feed. Breastfeeding times will vary from baby to baby. However, the following rules can serve as a guide to help you ensure that your baby is properly fed:  Newborns (babies 4 weeks of age or younger)  may breastfeed every 1-3 hours.  Newborns should not go longer than 3 hours during the day or 5 hours during the night without breastfeeding.  You should breastfeed your baby a minimum of 8 times in a 24-hour period until you begin to introduce solid foods to your baby at around 6 months of age. Breast milk pumping Pumping and storing breast milk allows you to ensure that your baby is exclusively fed your breast milk, even at times when you are unable to breastfeed. This is especially important if you are going back to work while you are still breastfeeding or when you are not able to be present during feedings. Your lactation consultant can give you guidelines on how long it is safe to store breast milk. A breast pump is a machine that allows you to pump milk from your breast into a sterile bottle. The pumped breast milk can then be stored in a refrigerator or freezer. Some breast pumps are operated by hand, while others use electricity. Ask your lactation consultant which type will work best for you. Breast pumps can be purchased, but some hospitals and breastfeeding support groups lease breast pumps on a monthly basis. A lactation consultant can teach you how to hand express breast milk, if you prefer not to use a pump. Caring for your breasts while you breastfeed Nipples can become dry, cracked, and sore while breastfeeding. The following recommendations can help keep your breasts moisturized and healthy:  Avoid using soap on your nipples.  Wear a supportive bra. Although not required, special nursing bras and tank tops are designed to allow access to your breasts for breastfeeding without taking off your entire bra or top. Avoid wearing underwire-style bras or extremely tight   bras.  Air dry your nipples for 3-4minutes after each feeding.  Use only cotton bra pads to absorb leaked breast milk. Leaking of breast milk between feedings is normal.  Use lanolin on your nipples after breastfeeding.  Lanolin helps to maintain your skin's normal moisture barrier. If you use pure lanolin, you do not need to wash it off before feeding your baby again. Pure lanolin is not toxic to your baby. You may also hand express a few drops of breast milk and gently massage that milk into your nipples and allow the milk to air dry. In the first few weeks after giving birth, some women experience extremely full breasts (engorgement). Engorgement can make your breasts feel heavy, warm, and tender to the touch. Engorgement peaks within 3-5 days after you give birth. The following recommendations can help ease engorgement:  Completely empty your breasts while breastfeeding or pumping. You may want to start by applying warm, moist heat (in the shower or with warm water-soaked hand towels) just before feeding or pumping. This increases circulation and helps the milk flow. If your baby does not completely empty your breasts while breastfeeding, pump any extra milk after he or she is finished.  Wear a snug bra (nursing or regular) or tank top for 1-2 days to signal your body to slightly decrease milk production.  Apply ice packs to your breasts, unless this is too uncomfortable for you.  Make sure that your baby is latched on and positioned properly while breastfeeding. If engorgement persists after 48 hours of following these recommendations, contact your health care provider or a lactation consultant. Overall health care recommendations while breastfeeding  Eat healthy foods. Alternate between meals and snacks, eating 3 of each per day. Because what you eat affects your breast milk, some of the foods may make your baby more irritable than usual. Avoid eating these foods if you are sure that they are negatively affecting your baby.  Drink milk, fruit juice, and water to satisfy your thirst (about 10 glasses a day).  Rest often, relax, and continue to take your prenatal vitamins to prevent fatigue, stress, and  anemia.  Continue breast self-awareness checks.  Avoid chewing and smoking tobacco. Chemicals from cigarettes that pass into breast milk and exposure to secondhand smoke may harm your baby.  Avoid alcohol and drug use, including marijuana. Some medicines that may be harmful to your baby can pass through breast milk. It is important to ask your health care provider before taking any medicine, including all over-the-counter and prescription medicine as well as vitamin and herbal supplements. It is possible to become pregnant while breastfeeding. If birth control is desired, ask your health care provider about options that will be safe for your baby. Contact a health care provider if:  You feel like you want to stop breastfeeding or have become frustrated with breastfeeding.  You have painful breasts or nipples.  Your nipples are cracked or bleeding.  Your breasts are red, tender, or warm.  You have a swollen area on either breast.  You have a fever or chills.  You have nausea or vomiting.  You have drainage other than breast milk from your nipples.  Your breasts do not become full before feedings by the fifth day after you give birth.  You feel sad and depressed.  Your baby is too sleepy to eat well.  Your baby is having trouble sleeping.  Your baby is wetting less than 3 diapers in a 24-hour period.  Your baby   has less than 3 stools in a 24-hour period.  Your baby's skin or the white part of his or her eyes becomes yellow.  Your baby is not gaining weight by 5 days of age. Get help right away if:  Your baby is overly tired (lethargic) and does not want to wake up and feed.  Your baby develops an unexplained fever. This information is not intended to replace advice given to you by your health care provider. Make sure you discuss any questions you have with your health care provider. Document Released: 06/22/2005 Document Revised: 12/04/2015 Document Reviewed:  12/14/2012 Elsevier Interactive Patient Education  2017 [employer]  

## 2016-08-06 NOTE — Progress Notes (Signed)
   PRENATAL VISIT NOTE  Subjective:  Caitlyn Mccormick is a 21 y.o. G2P1001 at 9343w6d being seen today for ongoing prenatal care.  She is currently monitored for the following issues for this low-risk pregnancy and has Supervision of other normal pregnancy, antepartum; Pregnancy with history of cesarean section, antepartum; History of gestational diabetes in prior pregnancy, currently pregnant; Rh negative state in antepartum period; and Anemia, antepartum on her problem list.  Patient reports no complaints.   . Vag. Bleeding: None.  Movement: Present. Denies leaking of fluid.   The following portions of the patient's history were reviewed and updated as appropriate: allergies, current medications, past family history, past medical history, past social history, past surgical history and problem list. Problem list updated.  Objective:   Vitals:   08/06/16 0931  BP: 117/66  Pulse: (!) 110  Weight: 176 lb (79.8 kg)    Fetal Status: Fetal Heart Rate (bpm): 154 Fundal Height: 25 cm Movement: Present     General:  Alert, oriented and cooperative. Patient is in no acute distress.  Skin: Skin is warm and dry. No rash noted.   Cardiovascular: Normal heart rate noted  Respiratory: Normal respiratory effort, no problems with respiration noted  Abdomen: Soft, gravid, appropriate for gestational age. Pain/Pressure: Absent     Pelvic:  Cervical exam deferred        Extremities: Normal range of motion.  Edema: None  Mental Status: Normal mood and affect. Normal behavior. Normal judgment and thought content.   Assessment and Plan:  Pregnancy: G2P1001 at 243w6d  1. Supervision of other normal pregnancy, antepartum Continue routine prenatal care. 28 wks labs next visit Discussed safety of dying hair while pregnant. - Prenatal Multivit-Min-Fe-FA (PRENATAL VITAMINS) 0.8 MG tablet; Take 1 tablet by mouth daily.  Dispense: 30 tablet; Refill: 12  2. Gastroesophageal reflux disease without esophagitis Having  some GERD--Rx given - omeprazole (PRILOSEC OTC) 20 MG tablet; Take 1 tablet (20 mg total) by mouth daily.  Dispense: 30 tablet; Refill: 3  Preterm labor symptoms and general obstetric precautions including but not limited to vaginal bleeding, contractions, leaking of fluid and fetal movement were reviewed in detail with the patient. Please refer to After Visit Summary for other counseling recommendations.  Return in 4 weeks (on 09/03/2016) for 28 wk labs.   Reva Boresanya S Kuron Docken, MD

## 2016-09-14 ENCOUNTER — Encounter: Payer: Medicaid Other | Admitting: Family Medicine

## 2017-04-27 ENCOUNTER — Encounter (HOSPITAL_COMMUNITY): Payer: Self-pay

## 2022-08-27 ENCOUNTER — Other Ambulatory Visit: Payer: Self-pay | Admitting: Obstetrics and Gynecology

## 2022-08-27 DIAGNOSIS — Z363 Encounter for antenatal screening for malformations: Secondary | ICD-10-CM

## 2022-08-27 LAB — OB RESULTS CONSOLE RPR: RPR: NONREACTIVE

## 2022-08-27 LAB — OB RESULTS CONSOLE ABO/RH: RH Type: NEGATIVE

## 2022-08-27 LAB — OB RESULTS CONSOLE HEPATITIS B SURFACE ANTIGEN: Hepatitis B Surface Ag: NEGATIVE

## 2022-08-27 LAB — OB RESULTS CONSOLE ANTIBODY SCREEN: Antibody Screen: NEGATIVE

## 2022-08-27 LAB — HEPATITIS C ANTIBODY: HCV Ab: NEGATIVE

## 2022-08-27 LAB — OB RESULTS CONSOLE RUBELLA ANTIBODY, IGM: Rubella: IMMUNE

## 2022-08-27 LAB — OB RESULTS CONSOLE HIV ANTIBODY (ROUTINE TESTING): HIV: NONREACTIVE

## 2022-09-01 LAB — OB RESULTS CONSOLE GC/CHLAMYDIA
Chlamydia: NEGATIVE
Neisseria Gonorrhea: NEGATIVE

## 2022-10-12 ENCOUNTER — Ambulatory Visit: Payer: Self-pay

## 2022-10-14 ENCOUNTER — Other Ambulatory Visit: Payer: Self-pay

## 2022-10-30 ENCOUNTER — Other Ambulatory Visit: Payer: Self-pay | Admitting: Obstetrics and Gynecology

## 2022-10-30 ENCOUNTER — Ambulatory Visit: Payer: Medicaid Other | Attending: Obstetrics and Gynecology

## 2022-10-30 ENCOUNTER — Other Ambulatory Visit: Payer: Self-pay | Admitting: *Deleted

## 2022-10-30 DIAGNOSIS — O99282 Endocrine, nutritional and metabolic diseases complicating pregnancy, second trimester: Secondary | ICD-10-CM

## 2022-10-30 DIAGNOSIS — O99212 Obesity complicating pregnancy, second trimester: Secondary | ICD-10-CM

## 2022-10-30 DIAGNOSIS — Z363 Encounter for antenatal screening for malformations: Secondary | ICD-10-CM | POA: Insufficient documentation

## 2022-10-30 DIAGNOSIS — O34219 Maternal care for unspecified type scar from previous cesarean delivery: Secondary | ICD-10-CM

## 2022-10-30 DIAGNOSIS — Z362 Encounter for other antenatal screening follow-up: Secondary | ICD-10-CM

## 2022-11-26 DIAGNOSIS — O24419 Gestational diabetes mellitus in pregnancy, unspecified control: Secondary | ICD-10-CM | POA: Insufficient documentation

## 2022-12-01 ENCOUNTER — Ambulatory Visit: Payer: Medicaid Other | Admitting: *Deleted

## 2022-12-01 ENCOUNTER — Other Ambulatory Visit: Payer: Self-pay | Admitting: *Deleted

## 2022-12-01 ENCOUNTER — Ambulatory Visit: Payer: Medicaid Other | Attending: Obstetrics and Gynecology

## 2022-12-01 VITALS — BP 102/54 | HR 88

## 2022-12-01 DIAGNOSIS — O99282 Endocrine, nutritional and metabolic diseases complicating pregnancy, second trimester: Secondary | ICD-10-CM | POA: Insufficient documentation

## 2022-12-01 DIAGNOSIS — O24415 Gestational diabetes mellitus in pregnancy, controlled by oral hypoglycemic drugs: Secondary | ICD-10-CM | POA: Diagnosis not present

## 2022-12-01 DIAGNOSIS — E039 Hypothyroidism, unspecified: Secondary | ICD-10-CM | POA: Diagnosis present

## 2022-12-01 DIAGNOSIS — O24419 Gestational diabetes mellitus in pregnancy, unspecified control: Secondary | ICD-10-CM

## 2022-12-01 DIAGNOSIS — O09293 Supervision of pregnancy with other poor reproductive or obstetric history, third trimester: Secondary | ICD-10-CM | POA: Diagnosis not present

## 2022-12-01 DIAGNOSIS — O34219 Maternal care for unspecified type scar from previous cesarean delivery: Secondary | ICD-10-CM

## 2022-12-01 DIAGNOSIS — O99283 Endocrine, nutritional and metabolic diseases complicating pregnancy, third trimester: Secondary | ICD-10-CM | POA: Diagnosis not present

## 2022-12-01 DIAGNOSIS — Z3A29 29 weeks gestation of pregnancy: Secondary | ICD-10-CM

## 2022-12-01 DIAGNOSIS — O99212 Obesity complicating pregnancy, second trimester: Secondary | ICD-10-CM | POA: Diagnosis present

## 2022-12-01 DIAGNOSIS — O99213 Obesity complicating pregnancy, third trimester: Secondary | ICD-10-CM

## 2022-12-01 DIAGNOSIS — Z362 Encounter for other antenatal screening follow-up: Secondary | ICD-10-CM | POA: Diagnosis present

## 2022-12-01 DIAGNOSIS — E669 Obesity, unspecified: Secondary | ICD-10-CM

## 2022-12-02 ENCOUNTER — Ambulatory Visit: Payer: Medicaid Other

## 2022-12-09 ENCOUNTER — Ambulatory Visit: Payer: Medicaid Other

## 2022-12-16 ENCOUNTER — Encounter: Payer: Medicaid Other | Attending: Internal Medicine | Admitting: Registered"

## 2022-12-16 ENCOUNTER — Encounter: Payer: Self-pay | Admitting: Registered"

## 2022-12-16 DIAGNOSIS — O24419 Gestational diabetes mellitus in pregnancy, unspecified control: Secondary | ICD-10-CM | POA: Diagnosis present

## 2022-12-16 NOTE — Progress Notes (Signed)
The following learning objectives were met by the patient during this course:   States the definition of Gestational Diabetes States why dietary management is important in controlling blood glucose Describes the effects each nutrient has on blood glucose levels Demonstrates ability to create a balanced meal plan Demonstrates carbohydrate counting  States when to check blood glucose levels Demonstrates proper blood glucose monitoring techniques States the effect of stress and exercise on blood glucose levels States the importance of limiting caffeine and abstaining from alcohol and smoking   Blood glucose monitor given: None. Patient has meter and checking blood sugar prior to class.   Patient instructed to monitor glucose levels: FBS: 60 - ? 95 mg/dL; 1 hr: <140 OR 2 hr: <120   Patient received handouts: Nutrition Diabetes and Pregnancy, including carb counting list and glucose log sheet   Patient will be seen for follow-up as needed. 

## 2022-12-18 ENCOUNTER — Other Ambulatory Visit: Payer: Self-pay | Admitting: Obstetrics and Gynecology

## 2022-12-29 ENCOUNTER — Encounter: Payer: Self-pay | Admitting: *Deleted

## 2022-12-29 ENCOUNTER — Ambulatory Visit: Payer: Medicaid Other | Admitting: *Deleted

## 2022-12-29 ENCOUNTER — Ambulatory Visit: Payer: Medicaid Other | Attending: Obstetrics and Gynecology

## 2022-12-29 DIAGNOSIS — O99213 Obesity complicating pregnancy, third trimester: Secondary | ICD-10-CM

## 2022-12-29 DIAGNOSIS — O09293 Supervision of pregnancy with other poor reproductive or obstetric history, third trimester: Secondary | ICD-10-CM

## 2022-12-29 DIAGNOSIS — O24415 Gestational diabetes mellitus in pregnancy, controlled by oral hypoglycemic drugs: Secondary | ICD-10-CM | POA: Diagnosis present

## 2022-12-29 DIAGNOSIS — O34219 Maternal care for unspecified type scar from previous cesarean delivery: Secondary | ICD-10-CM

## 2022-12-29 DIAGNOSIS — E039 Hypothyroidism, unspecified: Secondary | ICD-10-CM

## 2022-12-29 DIAGNOSIS — Z3A33 33 weeks gestation of pregnancy: Secondary | ICD-10-CM

## 2022-12-29 DIAGNOSIS — O99283 Endocrine, nutritional and metabolic diseases complicating pregnancy, third trimester: Secondary | ICD-10-CM

## 2022-12-29 DIAGNOSIS — E669 Obesity, unspecified: Secondary | ICD-10-CM

## 2023-01-05 ENCOUNTER — Ambulatory Visit: Payer: Medicaid Other | Attending: Obstetrics and Gynecology

## 2023-01-05 ENCOUNTER — Encounter: Payer: Self-pay | Admitting: *Deleted

## 2023-01-05 ENCOUNTER — Ambulatory Visit: Payer: Medicaid Other | Admitting: *Deleted

## 2023-01-05 DIAGNOSIS — O99283 Endocrine, nutritional and metabolic diseases complicating pregnancy, third trimester: Secondary | ICD-10-CM

## 2023-01-05 DIAGNOSIS — Z3A34 34 weeks gestation of pregnancy: Secondary | ICD-10-CM

## 2023-01-05 DIAGNOSIS — O34219 Maternal care for unspecified type scar from previous cesarean delivery: Secondary | ICD-10-CM

## 2023-01-05 DIAGNOSIS — E669 Obesity, unspecified: Secondary | ICD-10-CM

## 2023-01-05 DIAGNOSIS — E039 Hypothyroidism, unspecified: Secondary | ICD-10-CM

## 2023-01-05 DIAGNOSIS — O99213 Obesity complicating pregnancy, third trimester: Secondary | ICD-10-CM

## 2023-01-05 DIAGNOSIS — O24415 Gestational diabetes mellitus in pregnancy, controlled by oral hypoglycemic drugs: Secondary | ICD-10-CM | POA: Insufficient documentation

## 2023-01-05 DIAGNOSIS — O360130 Maternal care for anti-D [Rh] antibodies, third trimester, not applicable or unspecified: Secondary | ICD-10-CM

## 2023-01-05 DIAGNOSIS — O09293 Supervision of pregnancy with other poor reproductive or obstetric history, third trimester: Secondary | ICD-10-CM

## 2023-01-12 ENCOUNTER — Encounter: Payer: Self-pay | Admitting: *Deleted

## 2023-01-12 ENCOUNTER — Ambulatory Visit: Payer: Medicaid Other | Attending: Obstetrics and Gynecology

## 2023-01-12 ENCOUNTER — Ambulatory Visit: Payer: Medicaid Other | Admitting: *Deleted

## 2023-01-12 ENCOUNTER — Other Ambulatory Visit: Payer: Self-pay | Admitting: *Deleted

## 2023-01-12 DIAGNOSIS — O24415 Gestational diabetes mellitus in pregnancy, controlled by oral hypoglycemic drugs: Secondary | ICD-10-CM | POA: Diagnosis present

## 2023-01-12 DIAGNOSIS — E039 Hypothyroidism, unspecified: Secondary | ICD-10-CM

## 2023-01-12 DIAGNOSIS — O360130 Maternal care for anti-D [Rh] antibodies, third trimester, not applicable or unspecified: Secondary | ICD-10-CM | POA: Diagnosis not present

## 2023-01-12 DIAGNOSIS — E669 Obesity, unspecified: Secondary | ICD-10-CM

## 2023-01-12 DIAGNOSIS — O99213 Obesity complicating pregnancy, third trimester: Secondary | ICD-10-CM

## 2023-01-12 DIAGNOSIS — O34219 Maternal care for unspecified type scar from previous cesarean delivery: Secondary | ICD-10-CM

## 2023-01-12 DIAGNOSIS — Z3A35 35 weeks gestation of pregnancy: Secondary | ICD-10-CM

## 2023-01-12 DIAGNOSIS — O99283 Endocrine, nutritional and metabolic diseases complicating pregnancy, third trimester: Secondary | ICD-10-CM | POA: Diagnosis not present

## 2023-01-12 DIAGNOSIS — O09293 Supervision of pregnancy with other poor reproductive or obstetric history, third trimester: Secondary | ICD-10-CM

## 2023-01-13 LAB — OB RESULTS CONSOLE GBS: GBS: NEGATIVE

## 2023-01-19 ENCOUNTER — Ambulatory Visit: Payer: Medicaid Other

## 2023-01-19 ENCOUNTER — Ambulatory Visit: Payer: Medicaid Other | Attending: Maternal & Fetal Medicine | Admitting: *Deleted

## 2023-01-19 DIAGNOSIS — O24419 Gestational diabetes mellitus in pregnancy, unspecified control: Secondary | ICD-10-CM | POA: Insufficient documentation

## 2023-01-19 DIAGNOSIS — O24410 Gestational diabetes mellitus in pregnancy, diet controlled: Secondary | ICD-10-CM | POA: Diagnosis not present

## 2023-01-19 DIAGNOSIS — Z3A36 36 weeks gestation of pregnancy: Secondary | ICD-10-CM | POA: Diagnosis not present

## 2023-01-19 NOTE — Procedures (Signed)
Caitlyn Mccormick 01-Feb-1996 [redacted]w[redacted]d  Fetus A Non-Stress Test Interpretation for 01/19/23  Indication:  GDM-diet, obesity  Fetal Heart Rate A Mode: External Baseline Rate (A): 145 bpm Variability: Moderate Accelerations: 15 x 15 Decelerations: None Multiple birth?: No  Uterine Activity Mode: Palpation, Toco Contraction Frequency (min): Occas Contraction Quality: Mild Resting Tone Palpated: Relaxed Resting Time: Adequate  Interpretation (Fetal Testing) Nonstress Test Interpretation: Reactive Comments: Dr. Judeth Cornfield reviewed tracing.

## 2023-01-21 NOTE — Patient Instructions (Signed)
Caitlyn Mccormick  01/21/2023   Your procedure is scheduled on:  02/05/2023  Arrive at 0800 at Entrance C on [address] at [facility]  and [employer]. You are invited to use the FREE valet parking or use the Visitor's parking deck.  Pick up the phone at the desk and dial [phone].  Call this number if you have problems the morning of surgery: [phone]  Remember:   Do not eat food:(After Midnight) Desps de medianoche.  Do not drink clear liquids: (After Midnight) Desps de medianoche.  Take these medicines the morning of surgery with A SIP OF WATER:  levothyroxine   Do not wear jewelry, make-up or nail polish.  Do not wear lotions, powders, or perfumes. Do not wear deodorant.  Do not shave 48 hours prior to surgery.  Do not bring valuables to the hospital.  [facility] is not   responsible for any belongings or valuables brought to the hospital.  Contacts, dentures or bridgework may not be worn into surgery.  Leave suitcase in the car. After surgery it may be brought to your room.  For patients admitted to the hospital, checkout time is 11:00 AM the day of              discharge.      Please read over the following fact sheets that you were given:     Preparing for Surgery

## 2023-01-25 ENCOUNTER — Encounter (HOSPITAL_COMMUNITY): Payer: Self-pay

## 2023-01-26 ENCOUNTER — Ambulatory Visit: Payer: Medicaid Other | Admitting: *Deleted

## 2023-01-26 ENCOUNTER — Encounter: Payer: Self-pay | Admitting: *Deleted

## 2023-01-26 ENCOUNTER — Ambulatory Visit: Payer: Medicaid Other | Attending: Obstetrics and Gynecology

## 2023-01-26 DIAGNOSIS — O99283 Endocrine, nutritional and metabolic diseases complicating pregnancy, third trimester: Secondary | ICD-10-CM | POA: Diagnosis not present

## 2023-01-26 DIAGNOSIS — E669 Obesity, unspecified: Secondary | ICD-10-CM

## 2023-01-26 DIAGNOSIS — O99213 Obesity complicating pregnancy, third trimester: Secondary | ICD-10-CM

## 2023-01-26 DIAGNOSIS — O24415 Gestational diabetes mellitus in pregnancy, controlled by oral hypoglycemic drugs: Secondary | ICD-10-CM | POA: Insufficient documentation

## 2023-01-26 DIAGNOSIS — E039 Hypothyroidism, unspecified: Secondary | ICD-10-CM

## 2023-01-26 DIAGNOSIS — O09293 Supervision of pregnancy with other poor reproductive or obstetric history, third trimester: Secondary | ICD-10-CM

## 2023-01-26 DIAGNOSIS — O360130 Maternal care for anti-D [Rh] antibodies, third trimester, not applicable or unspecified: Secondary | ICD-10-CM

## 2023-01-26 DIAGNOSIS — Z3A37 37 weeks gestation of pregnancy: Secondary | ICD-10-CM

## 2023-01-26 DIAGNOSIS — O34219 Maternal care for unspecified type scar from previous cesarean delivery: Secondary | ICD-10-CM

## 2023-02-02 ENCOUNTER — Ambulatory Visit: Payer: Medicaid Other | Attending: Maternal & Fetal Medicine | Admitting: *Deleted

## 2023-02-02 DIAGNOSIS — O99213 Obesity complicating pregnancy, third trimester: Secondary | ICD-10-CM | POA: Diagnosis not present

## 2023-02-02 DIAGNOSIS — O24419 Gestational diabetes mellitus in pregnancy, unspecified control: Secondary | ICD-10-CM

## 2023-02-02 DIAGNOSIS — Z3A38 38 weeks gestation of pregnancy: Secondary | ICD-10-CM

## 2023-02-02 NOTE — Procedures (Signed)
Caitlyn Mccormick [birth year] [redacted]w[redacted]d  Fetus A Non-Stress Test Interpretation for 02/02/23--NST only   Indication: Gestational Diabetes medication controlled and obese  Fetal Heart Rate A Mode: External Baseline Rate (A): 145 bpm Variability: Moderate Accelerations: 15 x 15 Decelerations: None Multiple birth?: No  Uterine Activity Mode: Toco Contraction Frequency (min): irreg Contraction Duration (sec): 80-130 Contraction Quality: Mild Resting Tone Palpated: Relaxed  Interpretation (Fetal Testing) Nonstress Test Interpretation: Reactive Comments: Tracing reviewed byDr. Judeth Cornfield

## 2023-02-03 ENCOUNTER — Encounter (HOSPITAL_COMMUNITY)
Admission: RE | Admit: 2023-02-03 | Discharge: 2023-02-03 | Disposition: A | Discharge status: Home / Self Care | Payer: Medicaid Other | Source: Ambulatory Visit | Attending: Obstetrics and Gynecology | Admitting: Obstetrics and Gynecology

## 2023-02-03 VITALS — Ht 61.0 in | Wt 191.0 lb

## 2023-02-03 DIAGNOSIS — O34219 Maternal care for unspecified type scar from previous cesarean delivery: Secondary | ICD-10-CM | POA: Diagnosis not present

## 2023-02-03 DIAGNOSIS — Z01812 Encounter for preprocedural laboratory examination: Secondary | ICD-10-CM | POA: Diagnosis present

## 2023-02-03 LAB — CBC
HCT: 34.2 % — ABNORMAL LOW (ref 36.0–46.0)
Hemoglobin: 11.4 g/dL — ABNORMAL LOW (ref 12.0–15.0)
MCH: 27.5 pg (ref 26.0–34.0)
MCHC: 33.3 g/dL (ref 30.0–36.0)
MCV: 82.4 fL (ref 80.0–100.0)
Platelets: 202 10*3/uL (ref 150–400)
RBC: 4.15 MIL/uL (ref 3.87–5.11)
RDW: 14.2 % (ref 11.5–15.5)
WBC: 7.4 10*3/uL (ref 4.0–10.5)
nRBC: 0 % (ref 0.0–0.2)

## 2023-02-03 LAB — COMPREHENSIVE METABOLIC PANEL
ALT: 24 U/L (ref 0–44)
AST: 16 U/L (ref 15–41)
Albumin: 2.5 g/dL — ABNORMAL LOW (ref 3.5–5.0)
Alkaline Phosphatase: 85 U/L (ref 38–126)
Anion gap: 12 (ref 5–15)
BUN: 12 mg/dL (ref 6–20)
CO2: 19 mmol/L — ABNORMAL LOW (ref 22–32)
Calcium: 9 mg/dL (ref 8.9–10.3)
Chloride: 104 mmol/L (ref 98–111)
Creatinine, Ser: 0.74 mg/dL (ref 0.44–1.00)
GFR, Estimated: >60 mL/min (ref >60)
Glucose, Bld: 274 mg/dL — ABNORMAL HIGH (ref 70–99)
Potassium: 3.7 mmol/L (ref 3.5–5.1)
Sodium: 135 mmol/L (ref 135–145)
Total Bilirubin: 0.5 mg/dL (ref 0.3–1.2)
Total Protein: 5.9 g/dL — ABNORMAL LOW (ref 6.5–8.1)

## 2023-02-03 LAB — RPR: RPR Ser Ql: NONREACTIVE

## 2023-02-03 LAB — TYPE AND SCREEN
ABO/RH(D): B NEG
Antibody Screen: POSITIVE

## 2023-02-03 NOTE — Anesthesia Preprocedure Evaluation (Addendum)
Anesthesia Evaluation  Patient identified by MRN, date of birth, ID band Patient awake    Reviewed: Allergy & Precautions, NPO status , Patient's Chart, lab work & pertinent test results  Airway Mallampati: III  TM Distance: >3 FB Neck ROM: Full    Dental  (+) Teeth Intact, Dental Advisory Given   Pulmonary neg pulmonary ROS   Pulmonary exam normal breath sounds clear to auscultation       Cardiovascular negative cardio ROS Normal cardiovascular exam Rhythm:Regular Rate:Normal     Neuro/Psych negative neurological ROS  negative psych ROS   GI/Hepatic negative GI ROS, Neg liver ROS,,,  Endo/Other  diabetes, Well Controlled, GestationalHypothyroidism  BMI 36  Renal/GU negative Renal ROS  negative genitourinary   Musculoskeletal negative musculoskeletal ROS (+)    Abdominal   Peds  Hematology  (+) Blood dyscrasia, anemia Hb 11.4, plt 202   Anesthesia Other Findings   Reproductive/Obstetrics (+) Pregnancy Previous section x 3                             Anesthesia Physical Anesthesia Plan  ASA: 3  Anesthesia Plan: Spinal   Post-op Pain Management: Regional block, Ofirmev IV (intra-op)* and Toradol IV (intra-op)*   Induction:   PONV Risk Score and Plan: 3 and Ondansetron, Dexamethasone and Treatment may vary due to age or medical condition  Airway Management Planned: Natural Airway and Nasal Cannula  Additional Equipment: None  Intra-op Plan:   Post-operative Plan:   Informed Consent: I have reviewed the patients History and Physical, chart, labs and discussed the procedure including the risks, benefits and alternatives for the proposed anesthesia with the patient or authorized representative who has indicated his/her understanding and acceptance.     Dental advisory given  Plan Discussed with: CRNA  Anesthesia Plan Comments: (3rd section, cross x 2 units prbc)        Anesthesia Quick Evaluation

## 2023-02-05 ENCOUNTER — Encounter (HOSPITAL_COMMUNITY): Payer: Self-pay | Admitting: Obstetrics and Gynecology

## 2023-02-05 ENCOUNTER — Inpatient Hospital Stay (HOSPITAL_COMMUNITY): Payer: Medicaid Other | Admitting: Anesthesiology

## 2023-02-05 ENCOUNTER — Encounter (HOSPITAL_COMMUNITY)
Admission: AD | Disposition: A | Discharge status: Home / Self Care | Payer: Self-pay | Source: Home / Self Care | Attending: Obstetrics and Gynecology

## 2023-02-05 ENCOUNTER — Other Ambulatory Visit: Payer: Self-pay

## 2023-02-05 ENCOUNTER — Inpatient Hospital Stay (HOSPITAL_COMMUNITY)
Admission: AD | Admit: 2023-02-05 | Discharge: 2023-02-08 | DRG: 788 | Disposition: A | Discharge status: Home / Self Care | Payer: Medicaid Other | Attending: Obstetrics and Gynecology | Admitting: Obstetrics and Gynecology

## 2023-02-05 ENCOUNTER — Other Ambulatory Visit: Payer: Self-pay | Admitting: Obstetrics and Gynecology

## 2023-02-05 DIAGNOSIS — Z6791 Unspecified blood type, Rh negative: Secondary | ICD-10-CM | POA: Diagnosis not present

## 2023-02-05 DIAGNOSIS — O26899 Other specified pregnancy related conditions, unspecified trimester: Secondary | ICD-10-CM

## 2023-02-05 DIAGNOSIS — E039 Hypothyroidism, unspecified: Secondary | ICD-10-CM | POA: Diagnosis present

## 2023-02-05 DIAGNOSIS — O99284 Endocrine, nutritional and metabolic diseases complicating childbirth: Secondary | ICD-10-CM | POA: Diagnosis present

## 2023-02-05 DIAGNOSIS — O34219 Maternal care for unspecified type scar from previous cesarean delivery: Principal | ICD-10-CM

## 2023-02-05 DIAGNOSIS — O24425 Gestational diabetes mellitus in childbirth, controlled by oral hypoglycemic drugs: Secondary | ICD-10-CM | POA: Diagnosis present

## 2023-02-05 DIAGNOSIS — Z98891 History of uterine scar from previous surgery: Secondary | ICD-10-CM

## 2023-02-05 DIAGNOSIS — Z79890 Hormone replacement therapy (postmenopausal): Secondary | ICD-10-CM | POA: Diagnosis not present

## 2023-02-05 DIAGNOSIS — Z3A39 39 weeks gestation of pregnancy: Secondary | ICD-10-CM

## 2023-02-05 DIAGNOSIS — O34211 Maternal care for low transverse scar from previous cesarean delivery: Principal | ICD-10-CM | POA: Diagnosis present

## 2023-02-05 DIAGNOSIS — O26893 Other specified pregnancy related conditions, third trimester: Secondary | ICD-10-CM | POA: Diagnosis present

## 2023-02-05 DIAGNOSIS — O24419 Gestational diabetes mellitus in pregnancy, unspecified control: Secondary | ICD-10-CM | POA: Diagnosis present

## 2023-02-05 DIAGNOSIS — Z23 Encounter for immunization: Secondary | ICD-10-CM | POA: Diagnosis not present

## 2023-02-05 HISTORY — DX: Disorder of thyroid, unspecified: E07.9

## 2023-02-05 LAB — GLUCOSE, CAPILLARY
Glucose-Capillary: 121 mg/dL — ABNORMAL HIGH (ref 70–99)
Glucose-Capillary: 115 mg/dL — ABNORMAL HIGH (ref 70–99)

## 2023-02-05 SURGERY — Surgical Case
Anesthesia: Spinal

## 2023-02-05 MED ORDER — TRANEXAMIC ACID-NACL 1000-0.7 MG/100ML-% IV SOLN
1000.0000 mg | Freq: Once | INTRAVENOUS | Status: AC
  Administered 2023-02-05: 1000 mg via INTRAVENOUS

## 2023-02-05 MED ORDER — NALOXONE HCL 0.4 MG/ML IJ SOLN: 0.4000 mg | INTRAMUSCULAR | Status: DC | PRN

## 2023-02-05 MED ORDER — COCONUT OIL OIL
1.0000 | TOPICAL_OIL | Status: DC | PRN
  Administered 2023-02-07: 1 via TOPICAL

## 2023-02-05 MED ORDER — OXYTOCIN-SODIUM CHLORIDE 30-0.9 UT/500ML-% IV SOLN
INTRAVENOUS | Status: AC
  Filled 2023-02-05: qty 500

## 2023-02-05 MED ORDER — LEVOTHYROXINE SODIUM 100 MCG PO TABS
200.0000 ug | ORAL_TABLET | Freq: Every day | ORAL | Status: DC
  Administered 2023-02-06 – 2023-02-08 (×3): 200 ug via ORAL
  Filled 2023-02-05 (×3): qty 2

## 2023-02-05 MED ORDER — ACETAMINOPHEN 10 MG/ML IV SOLN
INTRAVENOUS | Status: DC | PRN
  Administered 2023-02-05: 1000 mg via INTRAVENOUS

## 2023-02-05 MED ORDER — SIMETHICONE 80 MG PO CHEW: 80.0000 mg | CHEWABLE_TABLET | ORAL | Status: DC | PRN

## 2023-02-05 MED ORDER — SODIUM CHLORIDE 0.9 % IR SOLN
Status: DC | PRN
  Administered 2023-02-05: 1000 mL

## 2023-02-05 MED ORDER — OXYTOCIN-SODIUM CHLORIDE 30-0.9 UT/500ML-% IV SOLN
INTRAVENOUS | Status: DC | PRN
  Administered 2023-02-05: 300 mL via INTRAVENOUS

## 2023-02-05 MED ORDER — MEPERIDINE HCL 25 MG/ML IJ SOLN: 6.2500 mg | INTRAMUSCULAR | Status: DC | PRN

## 2023-02-05 MED ORDER — DIPHENHYDRAMINE HCL 25 MG PO CAPS: 25.0000 mg | ORAL_CAPSULE | Freq: Four times a day (QID) | ORAL | Status: DC | PRN

## 2023-02-05 MED ORDER — WITCH HAZEL-GLYCERIN EX PADS: 1.0000 | MEDICATED_PAD | CUTANEOUS | Status: DC | PRN

## 2023-02-05 MED ORDER — ONDANSETRON HCL 4 MG/2ML IJ SOLN: 4.0000 mg | Freq: Three times a day (TID) | INTRAMUSCULAR | Status: DC | PRN

## 2023-02-05 MED ORDER — STERILE WATER FOR IRRIGATION IR SOLN
Status: DC | PRN
  Administered 2023-02-05: 1

## 2023-02-05 MED ORDER — MENTHOL 3 MG MT LOZG: 1.0000 | LOZENGE | OROMUCOSAL | Status: DC | PRN

## 2023-02-05 MED ORDER — PHENYLEPHRINE 80 MCG/ML (10ML) SYRINGE FOR IV PUSH (FOR BLOOD PRESSURE SUPPORT)
PREFILLED_SYRINGE | INTRAVENOUS | Status: AC
  Filled 2023-02-05: qty 10

## 2023-02-05 MED ORDER — KETOROLAC TROMETHAMINE 30 MG/ML IJ SOLN
INTRAMUSCULAR | Status: AC
  Filled 2023-02-05: qty 1

## 2023-02-05 MED ORDER — KETOROLAC TROMETHAMINE 30 MG/ML IJ SOLN
30.0000 mg | Freq: Four times a day (QID) | INTRAMUSCULAR | Status: AC
  Administered 2023-02-05 – 2023-02-06 (×4): 30 mg via INTRAVENOUS
  Filled 2023-02-05 (×4): qty 1

## 2023-02-05 MED ORDER — IBUPROFEN 600 MG PO TABS
600.0000 mg | ORAL_TABLET | Freq: Four times a day (QID) | ORAL | Status: DC
  Administered 2023-02-06 – 2023-02-08 (×8): 600 mg via ORAL
  Filled 2023-02-05 (×8): qty 1

## 2023-02-05 MED ORDER — NALOXONE HCL 4 MG/10ML IJ SOLN: 1.0000 ug/kg/h | INTRAVENOUS | Status: DC | PRN

## 2023-02-05 MED ORDER — ACETAMINOPHEN 500 MG PO TABS: 1000.0000 mg | ORAL_TABLET | Freq: Four times a day (QID) | ORAL | Status: DC

## 2023-02-05 MED ORDER — ONDANSETRON HCL 4 MG/2ML IJ SOLN
INTRAMUSCULAR | Status: AC
  Filled 2023-02-05: qty 2

## 2023-02-05 MED ORDER — OXYCODONE HCL 5 MG/5ML PO SOLN: 5.0000 mg | Freq: Once | ORAL | Status: DC | PRN

## 2023-02-05 MED ORDER — ZOLPIDEM TARTRATE 5 MG PO TABS: 5.0000 mg | ORAL_TABLET | Freq: Every evening | ORAL | Status: DC | PRN

## 2023-02-05 MED ORDER — LACTATED RINGERS IV SOLN: INTRAVENOUS | Status: DC | PRN

## 2023-02-05 MED ORDER — SODIUM CHLORIDE 0.9% FLUSH: 3.0000 mL | INTRAVENOUS | Status: DC | PRN

## 2023-02-05 MED ORDER — DIPHENHYDRAMINE HCL 25 MG PO CAPS: 25.0000 mg | ORAL_CAPSULE | ORAL | Status: DC | PRN

## 2023-02-05 MED ORDER — HYDROMORPHONE HCL 1 MG/ML IJ SOLN: 0.2500 mg | INTRAMUSCULAR | Status: DC | PRN

## 2023-02-05 MED ORDER — FENTANYL CITRATE (PF) 100 MCG/2ML IJ SOLN
INTRAMUSCULAR | Status: DC | PRN
  Administered 2023-02-05: 15 ug via INTRATHECAL

## 2023-02-05 MED ORDER — DEXAMETHASONE SODIUM PHOSPHATE 10 MG/ML IJ SOLN
INTRAMUSCULAR | Status: DC | PRN
  Administered 2023-02-05: 4 mg via INTRAVENOUS

## 2023-02-05 MED ORDER — RHO D IMMUNE GLOBULIN 1500 UNIT/2ML IJ SOSY
300.0000 ug | PREFILLED_SYRINGE | Freq: Once | INTRAMUSCULAR | Status: AC
  Administered 2023-02-06: 300 ug via INTRAVENOUS
  Filled 2023-02-05: qty 2

## 2023-02-05 MED ORDER — OXYTOCIN-SODIUM CHLORIDE 30-0.9 UT/500ML-% IV SOLN
2.5000 [IU]/h | INTRAVENOUS | Status: AC
  Administered 2023-02-05: 2.5 [IU]/h via INTRAVENOUS
  Filled 2023-02-05: qty 500

## 2023-02-05 MED ORDER — CEFAZOLIN SODIUM-DEXTROSE 2-4 GM/100ML-% IV SOLN
2.0000 g | Freq: Once | INTRAVENOUS | Status: AC
  Administered 2023-02-05: 2 g via INTRAVENOUS

## 2023-02-05 MED ORDER — SCOPOLAMINE 1 MG/3DAYS TD PT72
1.0000 | MEDICATED_PATCH | Freq: Once | TRANSDERMAL | Status: AC
  Administered 2023-02-05: 1.5 mg via TRANSDERMAL
  Filled 2023-02-05: qty 1

## 2023-02-05 MED ORDER — ONDANSETRON HCL 4 MG/2ML IJ SOLN: 4.0000 mg | Freq: Once | INTRAMUSCULAR | Status: DC | PRN

## 2023-02-05 MED ORDER — SENNOSIDES-DOCUSATE SODIUM 8.6-50 MG PO TABS
2.0000 | ORAL_TABLET | Freq: Every day | ORAL | Status: DC
  Administered 2023-02-06 – 2023-02-08 (×3): 2 via ORAL
  Filled 2023-02-05 (×3): qty 2

## 2023-02-05 MED ORDER — MORPHINE SULFATE (PF) 0.5 MG/ML IJ SOLN
INTRAMUSCULAR | Status: DC | PRN
  Administered 2023-02-05: 150 ug via INTRATHECAL

## 2023-02-05 MED ORDER — DIBUCAINE (PERIANAL) 1 % EX OINT: 1.0000 | TOPICAL_OINTMENT | CUTANEOUS | Status: DC | PRN

## 2023-02-05 MED ORDER — PHENYLEPHRINE HCL-NACL 20-0.9 MG/250ML-% IV SOLN
INTRAVENOUS | Status: AC
  Filled 2023-02-05: qty 250

## 2023-02-05 MED ORDER — TRANEXAMIC ACID-NACL 1000-0.7 MG/100ML-% IV SOLN
INTRAVENOUS | Status: AC
  Filled 2023-02-05: qty 100

## 2023-02-05 MED ORDER — PHENYLEPHRINE 80 MCG/ML (10ML) SYRINGE FOR IV PUSH (FOR BLOOD PRESSURE SUPPORT)
PREFILLED_SYRINGE | INTRAVENOUS | Status: DC | PRN
  Administered 2023-02-05: 80 ug via INTRAVENOUS

## 2023-02-05 MED ORDER — POVIDONE-IODINE 10 % EX SWAB
2.0000 | Freq: Once | CUTANEOUS | Status: AC
  Administered 2023-02-05: 2 via TOPICAL

## 2023-02-05 MED ORDER — EPHEDRINE 5 MG/ML INJ
INTRAVENOUS | Status: AC
  Filled 2023-02-05: qty 5

## 2023-02-05 MED ORDER — ACETAMINOPHEN 10 MG/ML IV SOLN
INTRAVENOUS | Status: AC
  Filled 2023-02-05: qty 100

## 2023-02-05 MED ORDER — DEXAMETHASONE SODIUM PHOSPHATE 10 MG/ML IJ SOLN
INTRAMUSCULAR | Status: AC
  Filled 2023-02-05: qty 1

## 2023-02-05 MED ORDER — ONDANSETRON HCL 4 MG/2ML IJ SOLN
INTRAMUSCULAR | Status: DC | PRN
  Administered 2023-02-05: 4 mg via INTRAVENOUS

## 2023-02-05 MED ORDER — FENTANYL CITRATE (PF) 100 MCG/2ML IJ SOLN
INTRAMUSCULAR | Status: AC
  Filled 2023-02-05: qty 2

## 2023-02-05 MED ORDER — KETOROLAC TROMETHAMINE 30 MG/ML IJ SOLN
30.0000 mg | Freq: Once | INTRAMUSCULAR | Status: AC | PRN
  Administered 2023-02-05: 30 mg via INTRAVENOUS

## 2023-02-05 MED ORDER — PHENYLEPHRINE HCL-NACL 20-0.9 MG/250ML-% IV SOLN
INTRAVENOUS | Status: DC | PRN
  Administered 2023-02-05: 50 ug/min via INTRAVENOUS

## 2023-02-05 MED ORDER — OXYCODONE HCL 5 MG PO TABS: 5.0000 mg | ORAL_TABLET | Freq: Once | ORAL | Status: DC | PRN

## 2023-02-05 MED ORDER — KETOROLAC TROMETHAMINE 30 MG/ML IJ SOLN: 30.0000 mg | Freq: Four times a day (QID) | INTRAMUSCULAR | Status: DC | PRN

## 2023-02-05 MED ORDER — PRENATAL MULTIVITAMIN CH
1.0000 | ORAL_TABLET | Freq: Every day | ORAL | Status: DC
  Administered 2023-02-06 – 2023-02-08 (×3): 1 via ORAL
  Filled 2023-02-05 (×3): qty 1

## 2023-02-05 MED ORDER — MORPHINE SULFATE (PF) 0.5 MG/ML IJ SOLN
INTRAMUSCULAR | Status: AC
  Filled 2023-02-05: qty 10

## 2023-02-05 MED ORDER — DIPHENHYDRAMINE HCL 50 MG/ML IJ SOLN: 12.5000 mg | INTRAMUSCULAR | Status: DC | PRN

## 2023-02-05 MED ORDER — AMISULPRIDE (ANTIEMETIC) 5 MG/2ML IV SOLN: 10.0000 mg | Freq: Once | INTRAVENOUS | Status: DC | PRN

## 2023-02-05 MED ORDER — ACETAMINOPHEN 500 MG PO TABS
1000.0000 mg | ORAL_TABLET | Freq: Four times a day (QID) | ORAL | Status: DC
  Administered 2023-02-05 – 2023-02-08 (×11): 1000 mg via ORAL
  Filled 2023-02-05 (×12): qty 2

## 2023-02-05 MED ORDER — BUPIVACAINE IN DEXTROSE 0.75-8.25 % IT SOLN
INTRATHECAL | Status: DC | PRN
  Administered 2023-02-05: 1.5 mL via INTRATHECAL

## 2023-02-05 MED ORDER — OXYCODONE HCL 5 MG PO TABS
5.0000 mg | ORAL_TABLET | ORAL | Status: DC | PRN
  Administered 2023-02-06 – 2023-02-08 (×4): 10 mg via ORAL
  Filled 2023-02-05 (×4): qty 2

## 2023-02-05 MED ORDER — SIMETHICONE 80 MG PO CHEW
80.0000 mg | CHEWABLE_TABLET | Freq: Three times a day (TID) | ORAL | Status: DC
  Administered 2023-02-05 – 2023-02-08 (×7): 80 mg via ORAL
  Filled 2023-02-05 (×7): qty 1

## 2023-02-05 SURGICAL SUPPLY — 41 items
APL PRP STRL LF DISP 70% ISPRP (MISCELLANEOUS) ×2
APL SKNCLS STERI-STRIP NONHPOA (GAUZE/BANDAGES/DRESSINGS) ×1
BENZOIN TINCTURE PRP APPL 2/3 (GAUZE/BANDAGES/DRESSINGS) ×1 IMPLANT
BINDER ABDOMINAL 12 ML 46-62 (SOFTGOODS) IMPLANT
CHLORAPREP W/TINT 26 (MISCELLANEOUS) ×2 IMPLANT
CLAMP UMBILICAL CORD (MISCELLANEOUS) ×1 IMPLANT
CLOTH BEACON ORANGE TIMEOUT ST (SAFETY) ×1 IMPLANT
DRSG OPSITE POSTOP 4X10 (GAUZE/BANDAGES/DRESSINGS) ×1 IMPLANT
ELECT REM PT RETURN 9FT ADLT (ELECTROSURGICAL) ×1
ELECTRODE REM PT RTRN 9FT ADLT (ELECTROSURGICAL) ×1 IMPLANT
EXTENDER TRAXI PANNICULUS (MISCELLANEOUS) IMPLANT
EXTRACTOR VACUUM M CUP 4 TUBE (SUCTIONS) IMPLANT
GAUZE SPONGE 4X4 12PLY STRL LF (GAUZE/BANDAGES/DRESSINGS) IMPLANT
GAUZE SPONGE 4X4 3PLY NS LF (GAUZE/BANDAGES/DRESSINGS) IMPLANT
GLOVE BIO SURGEON STRL SZ7.5 (GLOVE) ×1 IMPLANT
GLOVE BIOGEL PI IND STRL 7.0 (GLOVE) ×1 IMPLANT
GLOVE BIOGEL PI IND STRL 7.5 (GLOVE) ×1 IMPLANT
GOWN STRL REUS W/TWL LRG LVL3 (GOWN DISPOSABLE) ×2 IMPLANT
KIT ABG SYR 3ML LUER SLIP (SYRINGE) IMPLANT
MAT PREVALON FULL STRYKER (MISCELLANEOUS) IMPLANT
NDL HYPO 25X5/8 SAFETYGLIDE (NEEDLE) IMPLANT
NEEDLE HYPO 25X5/8 SAFETYGLIDE (NEEDLE) IMPLANT
NS IRRIG 1000ML POUR BTL (IV SOLUTION) ×1 IMPLANT
PACK C SECTION WH (CUSTOM PROCEDURE TRAY) ×1 IMPLANT
PAD ABD 8X10 STRL (GAUZE/BANDAGES/DRESSINGS) IMPLANT
PAD OB MATERNITY 4.3X12.25 (PERSONAL CARE ITEMS) ×1 IMPLANT
RETRACTOR TRAXI PANNICULUS (MISCELLANEOUS) IMPLANT
RTRCTR C-SECT PINK 25CM LRG (MISCELLANEOUS) ×1 IMPLANT
STRIP CLOSURE SKIN 1/2X4 (GAUZE/BANDAGES/DRESSINGS) ×1 IMPLANT
SUT CHROMIC 2 0 CT 1 (SUTURE) ×1 IMPLANT
SUT MNCRL 0 VIOLET CTX 36 (SUTURE) ×1 IMPLANT
SUT MNCRL AB 3-0 PS2 27 (SUTURE) ×1 IMPLANT
SUT PLAIN 2 0 XLH (SUTURE) ×1 IMPLANT
SUT VIC AB 0 CT1 36 (SUTURE) ×1 IMPLANT
SUT VIC AB 0 CTX 36 (SUTURE) ×3
SUT VIC AB 0 CTX36XBRD ANBCTRL (SUTURE) ×3 IMPLANT
SUT VIC AB 2-0 SH 27 (SUTURE)
SUT VIC AB 2-0 SH 27XBRD (SUTURE) IMPLANT
TOWEL OR 17X24 6PK STRL BLUE (TOWEL DISPOSABLE) ×1 IMPLANT
TRAY FOLEY W/BAG SLVR 14FR LF (SET/KITS/TRAYS/PACK) ×1 IMPLANT
WATER STERILE IRR 1000ML POUR (IV SOLUTION) ×1 IMPLANT

## 2023-02-05 NOTE — Anesthesia Procedure Notes (Signed)
Spinal  Patient location during procedure: OR Start time: 02/05/2023 10:02 AM End time: 02/05/2023 10:06 AM Reason for block: surgical anesthesia Staffing Performed: anesthesiologist  Anesthesiologist: Lannie Fields, DO Performed by: Lannie Fields, DO Authorized by: Lannie Fields, DO   Preanesthetic Checklist Completed: patient identified, IV checked, risks and benefits discussed, surgical consent, monitors and equipment checked, pre-op evaluation and timeout performed Spinal Block Patient position: sitting Prep: DuraPrep and site prepped and draped Patient monitoring: cardiac monitor, continuous pulse ox and blood pressure Approach: midline Location: L3-4 Injection technique: single-shot Needle Needle type: Pencan  Needle gauge: 24 G Needle length: 9 cm Assessment Sensory level: T6 Events: CSF return Additional Notes Functioning IV was confirmed and monitors were applied. Sterile prep and drape, including hand hygiene and sterile gloves were used. The patient was positioned and the spine was prepped. The skin was anesthetized with lidocaine.  Free flow of clear CSF was obtained prior to injecting local anesthetic into the CSF.  The spinal needle aspirated freely following injection.  The needle was carefully withdrawn.  The patient tolerated the procedure well.

## 2023-02-05 NOTE — Op Note (Signed)
Cesarean Section Procedure Note  Indications: P2 at 39wks  Pre-operative Diagnosis: H/o Previous Cesarean Section   Post-operative Diagnosis: H/o Previous Cesarean Section  Procedure: REPEAT CESAREAN SECTION  Surgeon: Osborn Coho, MD    Assistants: Rhea Pink, CNM  Anesthesia: Regional  Anesthesiologist: Lannie Fields, DO   Procedure Details  The patient was taken to the operating room secondary to h/o previous cesarean section after the risks, benefits, complications, treatment options, and expected outcomes were discussed with the patient.  The patient concurred with the proposed plan, giving informed consent which was signed and witnessed. The patient was taken to Operating Room A, identified as Caitlyn Mccormick and the procedure verified as C-Section Delivery. A Time Out was held and the above information confirmed.  After induction of anesthesia by obtaining a spinal, the patient was prepped and draped in the usual sterile manner. A Pfannenstiel skin incision was made and carried down through the subcutaneous tissue to the underlying layer of fascia.  The fascia was incised bilaterally and extended transversely bilaterally with the Mayo scissors. Kocher clamps were placed on the inferior aspect of the fascial incision and the underlying rectus muscle was separated from the fascia. The same was done on the superior aspect of the fascial incision.  The peritoneum was identified, entered bluntly and extended manually.  An Alexis self-retaining retractor was placed.  The utero-vesical peritoneal reflection was incised transversely and the bladder flap was bluntly freed from the lower uterine segment. A low transverse uterine incision was made with the scalpel and extended bilaterally with the bandage scissors.  The infant was delivered in vertex position without difficulty.  After the umbilical cord was clamped and cut, the infant was handed to the awaiting pediatricians.  Cord blood  was obtained for evaluation.  The placenta was removed intact and appeared to be within normal limits. The uterus was cleared of all clots and debris. The uterine incision was closed with running interlocking sutures of 0 Vicryl and a second imbricating layer was performed with 0 monocryl.   Bilateral tubes and ovaries appeared to be within normal limits.  Good hemostasis was noted.  Copious irrigation was performed until clear.  The peritoneum was repaired with 2-0 chromic via a running suture.  The muscle was reapproximated in the meantime with interrupted stitches of 0 vicryl.  The fascia was reapproximated with a running suture of 0 vicryl. The subcutaneous tissue was reapproximated with 3 interrupted sutures of 2-0 plain.  The skin was reapproximated with a subcuticular suture of 3-0 monocryl.  Steristrips were applied.  Instrument, sponge, and needle counts were correct prior to abdominal closure and at the conclusion of the case.  The patient was awaiting transfer to the recovery room in good condition.  Findings: Live female infant with Apgars 8 at one minute and 9 at five minutes.  Normal appearing bilateral ovaries and fallopian tubes were noted.  Estimated Blood Loss:  130 ml         Drains: Foley to gravity 150 cc         Total IV Fluids: 1800 ml         Specimens to Pathology: None         Complications:  None; patient tolerated the procedure well.         Disposition: PACU - hemodynamically stable.         Condition: stable  Attending Attestation: I performed the procedure.  I was present and scrubbed and the assistant was required  due to complexity of anatomy.

## 2023-02-05 NOTE — Lactation Note (Signed)
This note was copied from a baby's chart. Lactation Consultation Note  Patient Name: Caitlyn Mccormick [id number] Date: 02/05/2023 Age:29 hours Reason for consult: Initial assessment;Term;Maternal endocrine disorder (C/S delivery, LGA greater than 9 lbs at birth. infant is DAT+ and 1st blood sugar was less than 20 mg/dl and 2nd was 38 mg/ dl.) Birth Parent is GDM2.   P3, term female infant, Birth Parent feeding choice is breast and formula feeding. Per Birth Parent she briefly latch previous two children for only 2 days and hx of low milk supply. Birth Parent did reverse pressure softening prior to latching infant at the breast, Birth Parent latched infant on her left breast using the football hold position, infant latched for 10 minutes and afterwards was pace feed 20 mls of formula. Birth Parent set up with DEBP, was pumping as LC left the room. LC discussed infant's input and output and infant had one void diaper since birth. LC discussed the importance of maternal rest, diet and hydration. Birth Parent was made aware of O/P services, breastfeeding support groups, community resources, and our phone # for post-discharge questions.    Current feeding plan: 1- Prior to latching infant at the breast, Birth Parent will do reverse pressure softening or use hand pump to help evert nipple shaft out more. Birth Parent will continue to latching infant 1st for every feeding 8 to 12+ times within 24 hours, skin to skin. 2- Birth Parent knows to call RN/LC for further latch assistance if needed.  3- After latching infant at the breast, Birth Parent will continue to supplement infant with each feeding, will offer any EBM first and then formula per feeding based on infant's age and hours of life. Birth Parent has handout with feeding guidelines. 4- Birth Parent will continue to use DEBP every 3 hours for 15 minutes on initial setting.  Maternal Data Has patient been taught Hand Expression?: Yes Does the patient have  breastfeeding experience prior to this delivery?: Yes How long did the patient breastfeed?: Per Birth Parent, previous two children milk supply never establish she stopped BF after 2 days.  Feeding Mother's Current Feeding Choice: Breast Milk and Formula Nipple Type: Slow - flow  LATCH Score Latch: Grasps breast easily, tongue down, lips flanged, rhythmical sucking.  Audible Swallowing: A few with stimulation  Type of Nipple: Flat (Birth Parent did reverse pressure soften which help evert nipple shaft out more and Birth Parent knows she can use hand pump to pre-pump breast prior to latching infant.)  Comfort (Breast/Nipple): Soft / non-tender  Hold (Positioning): Assistance needed to correctly position infant at breast and maintain latch.  LATCH Score: 7   Lactation Tools Discussed/Used Tools: Pump Breast pump type: Double-Electric Breast Pump Pump Education: Setup, frequency, and cleaning;Milk Storage Reason for Pumping: Past hx of low milk supply, LGA infant, C/S delivery and Birth Parent GDM2 Pumping frequency: Birth Parent will continue to pump every 3 hours for 15 minutes to help stimulate milk supply.  Interventions Interventions: Assisted with latch;Support pillows;Skin to skin;Position options;Education;Breast feeding basics reviewed;Adjust position;DEBP;Breast compression;Reverse pressure;Pre-pump if needed;LC Services brochure;Breast massage  Discharge Pump: DEBP (Birth Parent has ordered her DEBP)  Consult Status Consult Status: Follow-up Date: 02/06/23 Follow-up type: In-patient    Frederico Hamman 02/05/2023, 5:49 PM

## 2023-02-05 NOTE — Transfer of Care (Signed)
Immediate Anesthesia Transfer of Care Note  Patient: Caitlyn Mccormick  Procedure(s) Performed: CESAREAN SECTION  Patient Location: PACU  Anesthesia Type:Spinal  Level of Consciousness: awake, alert , oriented, and patient cooperative  Airway & Oxygen Therapy: Patient Spontanous Breathing  Post-op Assessment: Report given to RN and Post -op Vital signs reviewed and stable  Post vital signs: Reviewed and stable  Last Vitals:  Vitals Value Taken Time  BP 110/74 02/05/23 1156  Temp    Pulse 96 02/05/23 1158  Resp 15 02/05/23 1158  SpO2 99 % 02/05/23 1158  Vitals shown include unfiled device data.  Last Pain:  Vitals:   02/05/23 0837  TempSrc: Oral         Complications: No notable events documented.

## 2023-02-05 NOTE — H&P (Addendum)
OB ADMISSION/ HISTORY & PHYSICAL:  Admission Date: 02/05/2023  7:55 AM  Admit Diagnosis: Repeat cesarean section  Caitlyn Mccormick is a 27 y.o. female G3P2002 [redacted]w[redacted]d presenting for repeat cesarean section.   History of current pregnancy: [id number]    Patient entered care with [facility] at 16 wks.   EDC by LMP.   Anatomy scan:  complete w/ anterior fundal placenta.   Antenatal testing: for A2DM started at 32 weeks Last evaluation: @ 37+4 8+15# (99%)   Significant prenatal events:  Patient Active Problem List   Diagnosis Date Noted   GDM (gestational diabetes mellitus) 11/26/2022   Rh negative state in antepartum period 06/25/2016   Anemia, antepartum 06/25/2016   Supervision of other normal pregnancy, antepartum 06/22/2016   Pregnancy with history of cesarean section, antepartum 06/22/2016   History of gestational diabetes in prior pregnancy, currently pregnant 06/22/2016    Prenatal Labs: ABO, Rh: --/--/B NEG Performed at [facility], [address], [place], [place] [address]  [phone]) Antibody: POS (07/31 0924) Rubella: Immune (02/22 0000)  RPR: NON REACTIVE (07/31 0917)  HBsAg: Negative (02/22 0000)  HIV: Non-reactive (02/22 0000)  GTT: abnormal 3 hr GBS: Negative/-- (07/10 0000)  GC/CHL: neg/neg Genetics: low-risk Vaccines:    OB History  Gravida Para Term Preterm AB Living  3 2 2     2   SAB IAB Ectopic Multiple Live Births          2    # Outcome Date GA Lbr Len/2nd Weight Sex Type Anes PTL Lv  3 Current           2 Term 03/06/15 [redacted]w[redacted]d  3685 g F CS-LTranv EPI N LIV     Complications: Maternal fever affecting labor  1 Term      CS-LTranv   LIV    Medical / Surgical History: Past medical history:  Past Medical History:  Diagnosis Date   Gestational diabetes    Thyroid disease     Past surgical history:  Past Surgical History:  Procedure Laterality Date   CESAREAN SECTION     EYE SURGERY     Family History:  Family History  Problem Relation Age of  Onset   Diabetes Mother    Cancer Neg Hx    Hypertension Neg Hx    Heart disease Neg Hx    Asthma Neg Hx     Social History:  reports that she has never smoked. She has never used smokeless tobacco. She reports that she does not drink alcohol and does not use drugs.  Allergies: Patient has no known allergies.   Current Medications at time of admission:  Prior to Admission medications   Medication Sig Start Date End Date Taking? Authorizing Provider  ferrous sulfate 325 (65 FE) MG tablet Take 325 mg by mouth daily with breakfast.   Yes [provider]  levothyroxine (SYNTHROID) 200 MCG tablet Take 200 mcg by mouth daily before breakfast.   Yes [provider]  metFORMIN (GLUCOPHAGE) 500 MG tablet Take 500 mg by mouth daily with breakfast. 01/03/23  Yes [provider]  pantoprazole (PROTONIX) 40 MG tablet Take 40 mg by mouth daily as needed (Heartburn). 12/21/22  Yes [provider]  Prenatal Multivit-Min-Fe-FA (PRENATAL VITAMINS) 0.8 MG tablet Take 1 tablet by mouth daily. 08/06/16  Yes Reva Bores, MD    Review of Systems: Constitutional: Negative   HENT: Negative   Eyes: Negative   Respiratory: Negative   Cardiovascular: Negative  Gastrointestinal: Negative  Musculoskeletal: Negative   Skin: Negative   Neurological: Negative   Endo/Heme/Allergies: Negative   Psychiatric/Behavioral: Negative    Physical Exam: VS: Blood pressure 120/60, pulse (!) 105, temperature 98.2 F (36.8 C), temperature source Oral, resp. rate 18, height 5\' 1"  (1.549 m), weight 86.2 kg, last menstrual period 05/01/2022, unknown if currently breastfeeding. AAO x3, no signs of distress Cardiovascular: RRR Respiratory: Unlabored GU/GI: Abdomen gravid, non-tender, non-distended, active FM   Cervical exam: deferred FHR: 145 via doppler  Prenatal Transfer Tool  Maternal Diabetes: Yes:  Diabetes Type:  Insulin/Medication controlled Metformin Genetic Screening:  Normal Maternal Ultrasounds/Referrals: Normal Fetal Ultrasounds or other Referrals:  None Maternal Substance Abuse:  No Significant Maternal Medications:  Meds include: Other:  levothyroxine, metformin Significant Maternal Lab Results: Group B Strep negative and Rh negative Number of Prenatal Visits:greater than 3 verified prenatal visits Other Comments:  None    Assessment: 27 y.o. [id number] [redacted]w[redacted]d  Repeat cesarean section GBS neg Pain management plan: per anesthesia   Plan:  Admit to L&D OR Routine pre-op admission orders Dr Su Hilt aware of admission and plan of care  Roma Schanz DNP, CNM 02/05/2023 9:54 AM

## 2023-02-06 ENCOUNTER — Encounter (HOSPITAL_COMMUNITY): Payer: Self-pay | Admitting: Obstetrics and Gynecology

## 2023-02-06 LAB — GLUCOSE, CAPILLARY: Glucose-Capillary: 80 mg/dL (ref 70–99)

## 2023-02-06 NOTE — Progress Notes (Signed)
Subjective: POD# 1 Information for the patient's newborn:  [place], [place] [[id number]]  female  Baby's Name Loney Loh has been moved to NICU  Reports feeling good, dressed in personal clothing and ambulaing independently Feeding: breast Reports tolerating PO and denies N/V, foley removed, ambulating and urinating w/o difficulty  Pain controlled with  PO meds Denies HA/SOB/dizziness  Flatus not passing Vaginal bleeding is normal, no clots     Objective:  VS:  Vitals:   02/05/23 1630 02/05/23 2001 02/06/23 0001 02/06/23 0438  BP: 107/63 (!) 102/58 105/65 110/62  Pulse: 86 80 79 81  Resp: 18 16 16 16   Temp: 98.4 F (36.9 C) 98.5 F (36.9 C) 98.4 F (36.9 C) 98.4 F (36.9 C)  TempSrc: Oral Oral Oral Oral  SpO2: 98% 98% 99% 98%  Weight:      Height:        Intake/Output Summary (Last 24 hours) at 02/06/2023 0910 Last data filed at 02/06/2023 0442 Gross per 24 hour  Intake 1834.75 ml  Output 1455 ml  Net 379.75 ml     Recent Labs    02/03/23 0917 02/06/23 0412  WBC 7.4 11.2*  HGB 11.4* 10.2*  HCT 34.2* 30.5*  PLT 202 169    Blood type: --/--/B NEG Performed at [facility], [address] [place], [place] [address]  [phone]) Rubella: Immune (02/22 0000)    Physical Exam:  General: alert, cooperative, and no distress CV: Regular rate and rhythm Resp: clear Abdomen: soft, non-tender Incision:  pressures dressing, clean, dry, intact Perineum:  Uterine Fundus: firm, below umbilicus, nontender Lochia: minimal Ext:  neg for swelling, tenderness, pain, and cords   Assessment/Plan: 27 y.o.   POD# 1. [id number]                  Principal Problem:   S/P cesarean section Active Problems:   Rh negative status during pregnancy   Gestational diabetes   Hypothyroidism  Fasting CBG 80 Rhogam given 02/06/23 Routine post-op PP care          Advance diet as tolerated Advised warm fluids and ambulation to improve GI motility Breastfeeding  support Anticipate D/C on POD 2 or 3  Roma Schanz, DNP, CNM 02/06/2023, 9:10 AM

## 2023-02-06 NOTE — Anesthesia Postprocedure Evaluation (Signed)
Anesthesia Post Note  Patient: Caitlyn Mccormick  Procedure(s) Performed: CESAREAN SECTION     Patient location during evaluation: PACU Anesthesia Type: Spinal Level of consciousness: awake and alert and oriented Pain management: pain level controlled Vital Signs Assessment: post-procedure vital signs reviewed and stable Respiratory status: spontaneous breathing, nonlabored ventilation and respiratory function stable Cardiovascular status: blood pressure returned to baseline and stable Postop Assessment: no headache, no backache, spinal receding and patient able to bend at knees Anesthetic complications: no   No notable events documented.  Last Vitals:  Vitals:   02/06/23 0001 02/06/23 0438  BP: 105/65 110/62  Pulse: 79 81  Resp: 16 16  Temp: 36.9 C 36.9 C  SpO2: 99% 98%    Last Pain:  Vitals:   02/06/23 0920  TempSrc:   PainSc: 0-No pain   Pain Goal:                   Lannie Fields

## 2023-02-06 NOTE — Lactation Note (Signed)
This note was copied from a baby's chart.  NICU Lactation Consultation Note  Patient Name: Girl Shereka Lafortune [id number] Date: 02/06/2023 Age:29 hours  Reason for consult: Follow-up assessment; NICU baby; Term; Maternal endocrine disorder; 1st time breastfeeding Type of Endocrine Disorder?: Thyroid; Diabetes (GDM, hypothyroidism)  SUBJECTIVE  LC in to visit with P3 Mom of term baby transferred to NICU at 20 hrs old for hypoglycemia. Mom tried to breastfeed baby this afternoon but baby became fussy, a bottle was given.    Mom doesn't have any experience breastfeeding as she quit trying after 2 days with her first 2 babies.  Unsure about low milk supply or low milk stimulation.  Mom has not pumped today.  DEBP was set up yesterday and Mom pumped once and expressed a drop that she fed to baby before baby was taken to NICU.  Talked about the importance of frequent pumping to support a full milk supply.  Mom has a history of low milk supply, but unsure if she was pumping or babies were latching.    LC set up a washing and drying bin to care for the pump parts.  Encouraged consistent pumping every 2-3 hrs if baby is not breastfeeding.  OBJECTIVE Infant data: Mother's Current Feeding Choice: Breast Milk and Formula  Infant feeding assessment Scale for Readiness: 1 Scale for Quality: 2   Maternal data: G3P3003 C-Section, Low Transverse Has patient been taught Hand Expression?: Yes Hand Expression Comments: Birth Parent was taught hand expression by RN, colostrum current not present with hand expression, Birth Parent is latching and supplementing infant with formula. Previous breastfeeding challenges?: Low milk supply Does the patient have breastfeeding experience prior to this delivery?: Yes How long did the patient breastfeed?: Per Birth Parent, previous two children milk supply never establish she stopped BF after 2 days. Pumping frequency: Mom hasn't pumped today, encouraged to pump every 3  hrs while baby is being supplemented Pumped volume: 0 mL (drop) Flange Size: 24  Pump: Personal, DEBP (Spectra 2)  ASSESSMENT Infant: LATCH Documentation Latch: 1 Audible Swallowing: 0 Type of Nipple: 1 Comfort (Breast/Nipple): 2 Hold (Positioning): 1 LATCH Score: 5  Feeding Status: Ad lib  Maternal: Milk volume: Normal  INTERVENTIONS/PLAN Interventions: Interventions: Breast feeding basics reviewed; Skin to skin; Breast massage; Hand express; DEBP; Hand pump Tools: Pump; Flanges; Bottle Pump Education: Setup, frequency, and cleaning; Milk Storage  Plan: 1- STS with baby in the NICU 2- Offer the breast with feeding cues, asking for help to assure a deep latch 3-Pump both breasts every 2-3 hrs or after breastfeeding attempts.  Consult Status: NICU follow-up NICU Follow-up type: Verify onset of copious milk; Verify absence of engorgement; New admission follow up   [name] 02/06/2023, 4:02 PM

## 2023-02-07 NOTE — Progress Notes (Signed)
Postpartum Note Day #2  S:  Patient doing well.  Pain controlled. Had some pain last night. Tolerating regular diet.   Ambulating and voiding without difficulty. Infant went to the NICU on the first night of her C/S due to hypoglycemia. Denies fevers, chills, chest pain, SOB, N/V, or worsening bilateral LE edema.  Lochia: Minimal Infant feeding:  Breast milk and formula Circumcision:  N/A, female infant Contraception:  To be discussed prior to discharge  O: Temp:  [98 F (36.7 C)-98.4 F (36.9 C)] 98 F (36.7 C) (08/04 0538) Pulse Rate:  [85-89] 86 (08/04 0538) Resp:  [16-17] 17 (08/04 0538) BP: (108-117)/(67-78) 117/78 (08/04 0538) SpO2:  [98 %-100 %] 100 % (08/04 0538) Gen: NAD, pleasant and cooperative CV: Regular rate Resp: Normal work of breathing Abdomen: soft, non-distended, non-tender throughout Uterus: firm, non-tender, below umbilicus Incision: c/d/i, pressure bandage in place  Ext: Trace bilateral LE edema, no bilateral calf tenderness  Labs:     Latest Ref Rng & Units 02/06/2023    4:12 AM 02/03/2023    9:17 AM 06/22/2016   10:43 AM  CBC  WBC 4.0 - 10.5 K/uL 11.2  7.4  9.5   Hemoglobin 12.0 - 15.0 g/dL 40.9  81.1  91.4   Hematocrit 36.0 - 46.0 % 30.5  34.2  33.8   Platelets 150 - 400 K/uL 169  202  233      A/P: Patient is a 27 y.o. [id number] POD#2 s/p repeat LTCS.  - Pain well controlled  - GU: UOP is adequate - GI: Tolerating regular diet - Activity: encouraged sitting up to chair and ambulation as tolerated - DVT Prophylaxis: Ambulation, SCDs in bed - Labs: as above  Disposition:  D/C home tomorrow.   Steva Ready, DO

## 2023-02-07 NOTE — Lactation Note (Addendum)
This note was copied from a baby's chart.  NICU Lactation Consultation Note  Patient Name: Caitlyn Mccormick [id number] Date: 02/07/2023 Age:29 hours  Reason for consult: Follow-up assessment; NICU baby; Maternal endocrine disorder; Term; Other (Comment) (LGA) Type of Endocrine Disorder?: Diabetes; Thyroid (GDM, hypothyroid (on synthroid))  SUBJECTIVE Visited with family of 79 hours old FT NICU female; Ms. Renovato is a P3 but not experienced breastfeeding. She reports she's pumping and seeing small droplets of colostrum, she was also taking baby to breast yesterday praised her for her efforts. Noticed that pumping hasn't been consistent. Explained the importance of consistent pumping for the onset of lactogenesis II and to protect her supply, she voiced understanding. Provided a hands-free pumping top in size L "Wallace Cullens" for hands on pumping, RN brought coconut oil in the room. Reviewed pumping schedule, lactogenesis II, feeding cues and anticipatory guidelines.  OBJECTIVE Infant data: Mother's Current Feeding Choice: Breast Milk and Formula  Infant feeding assessment Scale for Readiness: 2 Scale for Quality: 3   Maternal data: G3P3003 C-Section, Low Transverse Hand Expression Comments: colostrum noted Current breast feeding challenges:: NICU admission Previous breastfeeding challenges?: Low milk supply Does the patient have breastfeeding experience prior to this delivery?: Yes How long did the patient breastfeed?: Only for two days, per patient her milk never came in Pumping frequency: 2 times/24 hours Pumped volume: 0 mL (droplets) Flange Size: 24 Risk factor for low milk supply:: infant separation, GDM, hypothyroid  Pump: Personal (Spectra S2)  ASSESSMENT Infant: LATCH Documentation Latch: 1 Audible Swallowing: 0 Type of Nipple: 1 Comfort (Breast/Nipple): 2 Hold (Positioning): 1 LATCH Score: 5  Feeding Status: Ad lib  Maternal: Milk volume:  Normal  INTERVENTIONS/PLAN Interventions: Interventions: Breast feeding basics reviewed; Coconut oil; DEBP; Education Tools: Flanges; Pump; Hands-free pumping top; Coconut oil (size L "Gray") Pump Education: Setup, frequency, and cleaning; Milk Storage  Plan: Encouraged pumping every 3 hours, ideally 8 pumping sessions/24 hours Breast massage, hand expression and coconut oil were also encouraged prior pumping She'll continue taking baby to breast STS on feeding cues and will call for assistance when needed  FOB present and supportive. All questions and concerns answered, family to contact [facility] services PRN.  Consult Status: NICU follow-up NICU Follow-up type: Maternal D/C visit   Debera Lat 02/07/2023, 6:34 PM

## 2023-02-08 MED ORDER — MEDROXYPROGESTERONE ACETATE 150 MG/ML IM SUSP
150.0000 mg | Freq: Once | INTRAMUSCULAR | Status: AC
  Administered 2023-02-08: 150 mg via INTRAMUSCULAR
  Filled 2023-02-08: qty 1

## 2023-02-08 MED ORDER — SENNOSIDES-DOCUSATE SODIUM 8.6-50 MG PO TABS: 2.0000 | ORAL_TABLET | Freq: Every day | ORAL | 0 refills | Status: AC

## 2023-02-08 MED ORDER — IBUPROFEN 600 MG PO TABS: 600.0000 mg | ORAL_TABLET | Freq: Four times a day (QID) | ORAL | 0 refills | Status: AC

## 2023-02-08 MED ORDER — OXYCODONE HCL 5 MG PO TABS: 5.0000 mg | ORAL_TABLET | ORAL | 0 refills | Status: AC | PRN

## 2023-02-08 NOTE — Discharge Summary (Signed)
Postpartum Discharge Summary  Date of Service updated 02/08/2023     Patient Name: Caitlyn Mccormick DOB: Nov 14, 1995 MRN: [id number]  Date of admission: 02/05/2023 Delivery date:02/05/2023 Delivering provider: Osborn Coho Date of discharge: 02/08/2023  Admitting diagnosis: S/P cesarean section [Z98.891] Intrauterine pregnancy: [redacted]w[redacted]d     Secondary diagnosis:  Principal Problem:   S/P cesarean section Active Problems:   Rh negative status during pregnancy   Gestational diabetes   Hypothyroidism  Additional problems: None    Discharge diagnosis: Term Pregnancy Delivered                                              Post partum procedures:rhogam Augmentation: N/A Complications: None  Hospital course: Sceduled C/S   27 y.o. yo G3P3003 at [redacted]w[redacted]d was admitted to the hospital 02/05/2023 for scheduled cesarean section with the following indication:Elective Repeat.Delivery details are as follows:  Membrane Rupture Time/Date: 10:40 AM,02/05/2023  Delivery Method:C-Section, Low Transverse Operative Delivery:N/A Details of operation can be found in separate operative note.  Patient had a postpartum course complicated by none.  She is ambulating, tolerating a regular diet, passing flatus, and urinating well. Patient is discharged home in stable condition on  02/08/23        Newborn Data: Birth date:02/05/2023 Birth time:10:42 AM Gender:Female Living status:Living Apgars:8 ,9  Weight:4850 g    Magnesium Sulfate received: No BMZ received: No Rhophylac:Yes MMR:N/A T-DaP:Given prenatally Flu: N/A Transfusion:No  Physical exam  Vitals:   02/07/23 0538 02/07/23 1423 02/07/23 2125 02/08/23 0530  BP: 117/78 111/69 118/72 109/79  Pulse: 86 83 79 85  Resp: 17 16 17 16   Temp: 98 F (36.7 C) 98.2 F (36.8 C) 98.1 F (36.7 C) 98 F (36.7 C)  TempSrc: Oral Oral Oral Oral  SpO2: 100% 99% 100% 100%  Weight:      Height:       General: alert, cooperative, and no distress Lochia:  appropriate Uterine Fundus: firm Incision: Healing well with no significant drainage, No significant erythema DVT Evaluation: No evidence of DVT seen on physical exam. No significant calf/ankle edema. Labs: Lab Results  Component Value Date   WBC 11.2 (H) 02/06/2023   HGB 10.2 (L) 02/06/2023   HCT 30.5 (L) 02/06/2023   MCV 80.7 02/06/2023   PLT 169 02/06/2023      Latest Ref Rng & Units 02/03/2023    9:17 AM  CMP  Glucose 70 - 99 mg/dL 086   BUN 6 - 20 mg/dL 12   Creatinine 5.78 - 1.00 mg/dL 4.69   Sodium 629 - 528 mmol/L 135   Potassium 3.5 - 5.1 mmol/L 3.7   Chloride 98 - 111 mmol/L 104   CO2 22 - 32 mmol/L 19   Calcium 8.9 - 10.3 mg/dL 9.0   Total Protein 6.5 - 8.1 g/dL 5.9   Total Bilirubin 0.3 - 1.2 mg/dL 0.5   Alkaline Phos 38 - 126 U/L 85   AST 15 - 41 U/L 16   ALT 0 - 44 U/L 24    Edinburgh Score:    02/06/2023    9:20 AM  Edinburgh Postnatal Depression Scale Screening Tool  I have been able to laugh and see the funny side of things. 0  I have looked forward with enjoyment to things. 0  I have blamed myself unnecessarily when things went wrong. 0  I  have been anxious or worried for no good reason. 0  I have felt scared or panicky for no good reason. 0  Things have been getting on top of me. 0  I have been so unhappy that I have had difficulty sleeping. 0  I have felt sad or miserable. 0  I have been so unhappy that I have been crying. 0  The thought of harming myself has occurred to me. 0  Edinburgh Postnatal Depression Scale Total 0      After visit meds:  Allergies as of 02/08/2023   No Known Allergies      Medication List     TAKE these medications    ferrous sulfate 325 (65 FE) MG tablet Take 325 mg by mouth daily with breakfast.   ibuprofen 600 MG tablet Commonly known as: ADVIL Take 1 tablet (600 mg total) by mouth every 6 (six) hours.   levothyroxine 200 MCG tablet Commonly known as: SYNTHROID Take 200 mcg by mouth daily before  breakfast.   metFORMIN 500 MG tablet Commonly known as: GLUCOPHAGE Take 500 mg by mouth daily with breakfast.   oxyCODONE 5 MG immediate release tablet Commonly known as: Oxy IR/ROXICODONE Take 1-2 tablets (5-10 mg total) by mouth every 4 (four) hours as needed for moderate pain.   pantoprazole 40 MG tablet Commonly known as: PROTONIX Take 40 mg by mouth daily as needed (Heartburn).   Prenatal Vitamins 0.8 MG tablet Take 1 tablet by mouth daily.   senna-docusate 8.6-50 MG tablet Commonly known as: Senokot-S Take 2 tablets by mouth daily for 10 days. Start taking on: February 09, 2023               Discharge Care Instructions  (From admission, onward)           Start     Ordered   02/08/23 0000  Discharge wound care:       Comments: Remove dressing on 02/10/2023   02/08/23 1159             Discharge home in stable condition Infant Feeding: Bottle, Breast, and patient pumping as infant is in NICU for hypogylcemia Infant Disposition:NICU Discharge instruction: per After Visit Summary and Postpartum booklet. Activity: Advance as tolerated. Pelvic rest for 6 weeks.  Diet: routine diet Anticipated Birth Control: Depo given 02/08/2023 prior to discharge Postpartum Appointment:6 weeks Additional Postpartum F/U: 2 hour GTT and Incision check 1 week Future Appointments:No future appointments. Follow up Visit:  Follow-up Information     Osborn Coho, MD. Schedule an appointment as soon as possible for a visit in 1 week(s).   Specialty: Obstetrics and Gynecology Why: Incision check Contact information: [address] [place] [address] [phone]         [facility]. Schedule an appointment as soon as possible for a visit in 6 week(s).   Specialty: Obstetrics and Gynecology Why: Routine PP visit Contact information: [address] [place] [address] [phone]                Delivery Summary for Caitlyn Mccormick  Labor Events:   Preterm labor: No data found  Rupture date: No data found  Rupture time: No data found  Rupture type: No data found  Fluid Color: No data found  Induction: No data found  Augmentation: No data found  Complications: No data found  Cervical ripening: No data found No data found   No data found  Delivery:   Episiotomy: No data found  Lacerations: No data found  Repair suture: No data found  Repair # of packets: No data found  Blood loss (ml): No data found   Information for the patient's newborn:  Aireanna, Asai [[id number]]   Delivery 02/05/2023 10:42 AM by  C-Section, Low Transverse Sex:  female Gestational Age: [redacted]w[redacted]d Delivery Clinician:   Living?:         APGARS  One minute Five minutes Ten minutes  Skin color:        Heart rate:        Grimace:        Muscle tone:        Breathing:        Totals: 8  9      Presentation/position:      Resuscitation:   Cord information:    Disposition of cord blood:     Blood gases sent?  Complications:   Placenta: Delivered:       appearance Newborn Measurements: Weight: 10 lb 11.1 oz (4850 g)  Height: 21.5"  Head circumference:    Chest circumference:    Other providers:    Additional  information: Forceps:   Vacuum:   Breech:   Observed anomalies       Recommend 2hr GTT at [facility] visit as pt. Failed early 1hr GTT and did not do early 2hr or 3hr. Failed 3hr at 25 weeks. GDMA2 controlled on metformin 500 mg qhs. Infant admitted to NICU for hypoglycemia PPD#1.       02/08/2023 Jackie Plum, MD

## 2023-02-08 NOTE — Lactation Note (Signed)
This note was copied from a baby's chart.  NICU Lactation Consultation Note  Patient Name: Caitlyn Mccormick [id number] Date: 02/08/2023 Age:29 hours  Reason for consult: Follow-up assessment; NICU baby; Term; Maternal discharge Type of Endocrine Disorder?: Diabetes; Thyroid  SUBJECTIVE  LC in to visit with P3 Mom of term baby in the NICU for blood glucose regulation.  Baby is currently ad lib feeding formula by bottle.    Mom has been pumping, and getting small volumes to take to NICU for baby.  Encouraged continued double pumping to support milk supply.  Encouraged STS with baby in the NICU.  LC offered to assist with baby going to the breast if she would like.  Mom to ask baby's RN to call [facility] tomorrow.  LC provided more breast milk bottles for Mom to take with her.  Engorgement prevention and treatment reviewed.  OBJECTIVE Infant data: Mother's Current Feeding Choice: Breast Milk and Formula  Infant feeding assessment Scale for Readiness: 1 Scale for Quality: 2   Maternal data: G3P3003 C-Section, Low Transverse Hand Expression Comments: colostrum noted Current breast feeding challenges:: NICU admission Previous breastfeeding challenges?: Low milk supply Does the patient have breastfeeding experience prior to this delivery?: Yes How long did the patient breastfeed?: Only for two days, per patient her milk never came in Pumping frequency: 6-8 times per 24 hrs Pumped volume: 1 mL Flange Size: 24 Risk factor for low milk supply:: infant separation, GDM, hypothyroid  Pump: Personal (Spectra S2)  ASSESSMENT Infant: Feeding Status: Ad lib  Maternal: Milk volume: Normal  INTERVENTIONS/PLAN Interventions: Interventions: Breast feeding basics reviewed; Skin to skin; Breast massage; Hand express; DEBP; Ice; Education Discharge Education: Engorgement and breast care Tools: Flanges; Pump; Hands-free pumping top Pump Education: Setup, frequency, and cleaning; Milk  Storage  Plan: Consult Status: NICU follow-up NICU Follow-up type: Maternal D/C visit; Verify onset of copious milk; Verify absence of engorgement   Caitlyn Mccormick 02/08/2023, 1:24 PM

## 2023-02-08 NOTE — Progress Notes (Signed)
Patient screened out for psychosocial assessment since none of the following apply: °Psychosocial stressors documented in mother or baby's chart °Gestation less than 32 weeks °Code at delivery  °Infant with anomalies °Please contact the Clinical Social Worker if specific needs arise, by MOB's request, or if MOB scores greater than 9/yes to question 10 on Edinburgh Postpartum Depression Screen. ° °Giannamarie Paulus Boyd-Gilyard, MSW, LCSW °Clinical Social Work °[phone] °  °

## 2023-02-09 ENCOUNTER — Ambulatory Visit: Payer: Medicaid Other

## 2023-02-15 ENCOUNTER — Ambulatory Visit (HOSPITAL_COMMUNITY): Payer: Self-pay

## 2023-02-15 NOTE — Lactation Note (Signed)
This note was copied from a baby's chart.  NICU Lactation Consultation Note  Patient Name: Caitlyn Mccormick [id number] Date: 02/15/2023 Age:29 days  Reason for consult: Weekly NICU follow-up; NICU baby; Term; Maternal endocrine disorder Type of Endocrine Disorder?: Diabetes; Thyroid (GDM, hypothyroid)  SUBJECTIVE Visited with family of 58 days old FT NICU female; Caitlyn Mccormick is a P3 and reports she continues pumping consistently and her supply has finally increased to WNL, praised her for her efforts. She's been mostly working on bottle feedings while in the NICU due to [name] getting frustrated at the breast due to flow rate, she's taking 100-110 ml per feeding via bottle. Advised to try putting her at the breast when she's not "too hungry" and try a small volume in a bottle first as a "snack" before getting baby to latch. Family is taking baby home today. Reviewed discharge education, strategies to increase supply and the importance of consistent pumping after feedings at the breast to protect her supply. She politely declined an LC OP referral and voiced she'll be following up with lactation at her pediatrician's office @ [facility] [place] Female visitor present. All questions and concerns answered, family to contact [facility] services PRN.  OBJECTIVE Infant data: Mother's Current Feeding Choice: Breast Milk and Formula  Infant feeding assessment Scale for Readiness: 1 Scale for Quality: 1   Maternal data: G3P3003 C-Section, Low Transverse Pumping frequency: 6-7 times/24 hours Pumped volume: 90 mL (90-120 ml) Flange Size: 24  Pump: Personal (Spectra S2)  ASSESSMENT Infant: Feeding Status: Ad lib  Maternal: Milk volume: Normal  INTERVENTIONS/PLAN Interventions: Interventions: Breast feeding basics reviewed; Coconut oil; DEBP; Education Discharge Education: Outpatient recommendation Tools: Pump; Flanges; Hands-free pumping top; Coconut oil (L "Gray") Pump  Education: Setup, frequency, and cleaning; Milk Storage  Plan: Consult Status: Complete   Caitlyn Mccormick 02/15/2023, 12:53 PM

## 2023-03-02 ENCOUNTER — Telehealth (HOSPITAL_COMMUNITY): Payer: Self-pay

## 2023-03-02 NOTE — Telephone Encounter (Signed)
03/02/2023 1511  Name: Caitlyn Mccormick MRN: [id number] DOB: 05/31/1996  Reason for Call:  Transition of Care Hospital Discharge Call  Contact Status: Patient Contact Status: Complete  Language assistant needed: Interpreter Mode: Interpreter Not Needed        Follow-Up Questions: Do You Have Any Concerns About Your Health As You Heal From Delivery?: No Do You Have Any Concerns About Your Infants Health?: No  Edinburgh Postnatal Depression Scale:  In the Past 7 Days: I have been able to laugh and see the funny side of things.: As much as I always could I have looked forward with enjoyment to things.: As much as I ever did I have blamed myself unnecessarily when things went wrong.: Not very often I have been anxious or worried for no good reason.: No, not at all I have felt scared or panicky for no good reason.: No, not at all Things have been getting on top of me.: No, I have been coping as well as ever I have been so unhappy that I have had difficulty sleeping.: Not at all I have felt sad or miserable.: No, not at all I have been so unhappy that I have been crying.: No, never The thought of harming myself has occurred to me.: Never Inocente Salles Postnatal Depression Scale Total: 1  PHQ2-9 Depression Scale:     Discharge Follow-up: Edinburgh score requires follow up?: No Patient was advised of the following resources:: Support Group, Breastfeeding Support Group Did patient express any COVID concerns?: No  Post-discharge interventions: Reviewed Newborn Safe Sleep Practices  Signature  Signe Colt

## 2023-06-28 ENCOUNTER — Other Ambulatory Visit: Payer: Medicaid Other

## 2023-07-06 ENCOUNTER — Ambulatory Visit: Payer: Medicaid Other | Admitting: "Endocrinology
